# Patient Record
Sex: Male | Born: 1955 | Race: Black or African American | Hispanic: No | Marital: Married | Smoking: Never smoker
Health system: Southern US, Community
[De-identification: ages and names within clinical notes are randomized; demographics above are authoritative.]

## PROBLEM LIST (undated history)

## (undated) DIAGNOSIS — E119 Type 2 diabetes mellitus without complications: Secondary | ICD-10-CM

## (undated) DIAGNOSIS — I10 Essential (primary) hypertension: Secondary | ICD-10-CM

## (undated) DIAGNOSIS — N200 Calculus of kidney: Secondary | ICD-10-CM

---

## 2012-06-03 ENCOUNTER — Encounter (HOSPITAL_COMMUNITY): Payer: Self-pay | Admitting: Emergency Medicine

## 2012-06-03 ENCOUNTER — Emergency Department (HOSPITAL_COMMUNITY)
Admission: EM | Admit: 2012-06-03 | Discharge: 2012-06-03 | Disposition: A | Discharge status: Home / Self Care | Payer: Medicaid Other | Source: Home / Self Care | Attending: Emergency Medicine | Admitting: Emergency Medicine

## 2012-06-03 DIAGNOSIS — K120 Recurrent oral aphthae: Secondary | ICD-10-CM

## 2012-06-03 MED ORDER — PREDNISONE 20 MG PO TABS: 20.0000 mg | ORAL_TABLET | Freq: Two times a day (BID) | ORAL | Status: DC

## 2012-06-03 MED ORDER — TRIAMCINOLONE ACETONIDE 0.1 % MT PSTE: PASTE | OROMUCOSAL | Status: DC

## 2012-06-03 NOTE — ED Provider Notes (Signed)
Chief Complaint  Patient presents with  . Oral Swelling    History of Present Illness:   The patient is a 56 year old male who presents with a 4 to five-day history of the left side of his tongue hurting. This radiates into his teeth and also causes a headache. He's felt feverish. He denies any visual symptoms, nasal congestion, drainage, sore throat, or adenopathy. He's had no numbness, tingling, weakness, or other neurological complaints. He speaks only Arabic and does not speak English very well. The history was obtained with the help of a telephone interpreter.  Review of Systems:  Other than noted above, the patient denies any of the following symptoms: Systemic:  No fevers, chills, sweats, weight loss or gain, fatigue, or tiredness. Eye:  No redness, pain, discharge, itching, blurred vision, or diplopia. ENT:  No headache, nasal congestion, sneezing, itching, epistaxis, ear pain, congestion, decreased hearing, ringing in ears, vertigo, or tinnitus.  No oral lesions, sore throat, pain on swallowing, or hoarseness. Neck:  No mass, tenderness or adenopathy. Lungs:  No coughing, wheezing, or shortness of breath. Skin:  No rash or itching.  PMFSH:  Past medical history, family history, social history, meds, and allergies were reviewed.  Physical Exam:   Vital signs:  BP 143/77  Pulse 55  Temp 98.1 F (36.7 C) (Oral)  Resp 16  SpO2 100% General:  Alert and oriented.  In no distress.  Skin warm and dry. Eye:  PERRL, full EOMs, lids and conjunctiva normal.   ENT:  TMs and canals clear.  Nasal mucosa not congested and without drainage.  Mucous membranes moist, there is a large aphthous ulcer on the left side of the tongue which was very tender to palpation, normal dentition, pharynx clear.  No cranial or facial pain to palplation. Neck:  Supple, full ROM.  No adenopathy, tenderness or mass.  Thyroid normal. Lungs:  Breath sounds clear and equal bilaterally.  No wheezes, rales or  rhonchi. Heart:  Rhythm regular, without extrasystoles.  No gallops or murmers. Skin:  Clear, warm and dry.  Assessment:  The encounter diagnosis was Aphthous ulcer.  Plan:   1.  The following meds were prescribed:   New Prescriptions   PREDNISONE (DELTASONE) 20 MG TABLET    Take 1 tablet (20 mg total) by mouth 2 (two) times daily.   TRIAMCINOLONE (KENALOG) 0.1 % PASTE    Apply to ulcer on tongue 3 times daily.  Do not eat or drink for 30 minutes afterward.   2.  The patient was instructed in symptomatic care and handouts were given. 3.  The patient was told to return if becoming worse in any way, if no better in 3 or 4 days, and given some red flag symptoms that would indicate earlier return.      Reuben Likes, MD 06/03/12 [phone]

## 2012-06-03 NOTE — ED Notes (Signed)
Assessment in progress per Dr. Lorenz Coaster via [facility].

## 2012-06-03 NOTE — ED Notes (Addendum)
Pt states for about 3 days his tongue has been bothering him. The pt states the irritation came on suddenly and doesn't know of any injury or allergen. Pt states it hurts and he can't eat anything. Pt also says he has noticed it being red and swollen. Pt states he has never had anything like this before. Pt states he has blood work and images pending with his primary care physician but he is unaware of any medical history besides kidney stones.

## 2019-01-26 ENCOUNTER — Emergency Department (HOSPITAL_COMMUNITY)
Admission: EM | Admit: 2019-01-26 | Discharge: 2019-01-26 | Disposition: A | Discharge status: Home / Self Care | Payer: Self-pay | Attending: Emergency Medicine | Admitting: Emergency Medicine

## 2019-01-26 ENCOUNTER — Emergency Department (HOSPITAL_COMMUNITY): Payer: Self-pay

## 2019-01-26 ENCOUNTER — Other Ambulatory Visit: Payer: Self-pay

## 2019-01-26 ENCOUNTER — Encounter (HOSPITAL_COMMUNITY): Payer: Self-pay | Admitting: Emergency Medicine

## 2019-01-26 DIAGNOSIS — N200 Calculus of kidney: Secondary | ICD-10-CM | POA: Insufficient documentation

## 2019-01-26 DIAGNOSIS — I10 Essential (primary) hypertension: Secondary | ICD-10-CM | POA: Insufficient documentation

## 2019-01-26 DIAGNOSIS — Z20828 Contact with and (suspected) exposure to other viral communicable diseases: Secondary | ICD-10-CM | POA: Insufficient documentation

## 2019-01-26 HISTORY — DX: Essential (primary) hypertension: I10

## 2019-01-26 HISTORY — DX: Calculus of kidney: N20.0

## 2019-01-26 LAB — I-STAT CHEM 8, ED
BUN: 25 mg/dL — ABNORMAL HIGH (ref 8–23)
Calcium, Ion: 1.12 mmol/L — ABNORMAL LOW (ref 1.15–1.40)
Chloride: 102 mmol/L (ref 98–111)
Creatinine, Ser: 1.2 mg/dL (ref 0.61–1.24)
Glucose, Bld: 137 mg/dL — ABNORMAL HIGH (ref 70–99)
HCT: 43 % (ref 39.0–52.0)
Hemoglobin: 14.6 g/dL (ref 13.0–17.0)
Potassium: 3.4 mmol/L — ABNORMAL LOW (ref 3.5–5.1)
Sodium: 137 mmol/L (ref 135–145)
TCO2: 27 mmol/L (ref 22–32)

## 2019-01-26 LAB — CBC WITH DIFFERENTIAL/PLATELET
Abs Immature Granulocytes: 0.02 10*3/uL (ref 0.00–0.07)
Basophils Absolute: 0.1 10*3/uL (ref 0.0–0.1)
Basophils Relative: 1 %
Eosinophils Absolute: 0 10*3/uL (ref 0.0–0.5)
Eosinophils Relative: 1 %
HCT: 40.9 % (ref 39.0–52.0)
Hemoglobin: 14.2 g/dL (ref 13.0–17.0)
Immature Granulocytes: 0 %
Lymphocytes Relative: 17 %
Lymphs Abs: 1.4 10*3/uL (ref 0.7–4.0)
MCH: 29.8 pg (ref 26.0–34.0)
MCHC: 34.7 g/dL (ref 30.0–36.0)
MCV: 85.9 fL (ref 80.0–100.0)
Monocytes Absolute: 0.6 10*3/uL (ref 0.1–1.0)
Monocytes Relative: 7 %
Neutro Abs: 5.9 10*3/uL (ref 1.7–7.7)
Neutrophils Relative %: 74 %
Platelets: 188 10*3/uL (ref 150–400)
RBC: 4.76 MIL/uL (ref 4.22–5.81)
RDW: 12.7 % (ref 11.5–15.5)
WBC: 8 10*3/uL (ref 4.0–10.5)
nRBC: 0 % (ref 0.0–0.2)

## 2019-01-26 LAB — URINALYSIS, ROUTINE W REFLEX MICROSCOPIC
Bilirubin Urine: NEGATIVE
Glucose, UA: NEGATIVE mg/dL
Ketones, ur: NEGATIVE mg/dL
Leukocytes,Ua: NEGATIVE
Nitrite: NEGATIVE
Protein, ur: NEGATIVE mg/dL
Specific Gravity, Urine: 1.017 (ref 1.005–1.030)
pH: 6 (ref 5.0–8.0)

## 2019-01-26 LAB — SARS CORONAVIRUS 2 BY RT PCR (HOSPITAL ORDER, PERFORMED IN ~~LOC~~ HOSPITAL LAB): SARS Coronavirus 2: NEGATIVE

## 2019-01-26 MED ORDER — FENTANYL CITRATE (PF) 100 MCG/2ML IJ SOLN
100.0000 ug | Freq: Once | INTRAMUSCULAR | Status: AC
  Administered 2019-01-26: 100 ug via INTRAVENOUS
  Filled 2019-01-26: qty 2

## 2019-01-26 MED ORDER — OXYCODONE-ACETAMINOPHEN 5-325 MG PO TABS
2.0000 | ORAL_TABLET | Freq: Once | ORAL | Status: AC
  Administered 2019-01-26: 2 via ORAL
  Filled 2019-01-26: qty 2

## 2019-01-26 MED ORDER — ONDANSETRON HCL 4 MG/2ML IJ SOLN
4.0000 mg | Freq: Once | INTRAMUSCULAR | Status: AC
  Administered 2019-01-26: 4 mg via INTRAVENOUS
  Filled 2019-01-26: qty 2

## 2019-01-26 MED ORDER — ONDANSETRON 8 MG PO TBDP: ORAL_TABLET | ORAL | 0 refills | Status: DC

## 2019-01-26 MED ORDER — DICLOFENAC SODIUM ER 100 MG PO TB24: 100.0000 mg | ORAL_TABLET | Freq: Every day | ORAL | 0 refills | Status: DC

## 2019-01-26 MED ORDER — OXYCODONE-ACETAMINOPHEN 5-325 MG PO TABS: 1.0000 | ORAL_TABLET | Freq: Four times a day (QID) | ORAL | 0 refills | Status: DC | PRN

## 2019-01-26 MED ORDER — KETOROLAC TROMETHAMINE 30 MG/ML IJ SOLN
15.0000 mg | Freq: Once | INTRAMUSCULAR | Status: AC
  Administered 2019-01-26: 15 mg via INTRAVENOUS
  Filled 2019-01-26: qty 1

## 2019-01-26 MED ORDER — TAMSULOSIN HCL 0.4 MG PO CAPS
0.4000 mg | ORAL_CAPSULE | ORAL | Status: AC
  Administered 2019-01-26: 0.4 mg via ORAL
  Filled 2019-01-26: qty 1

## 2019-01-26 MED ORDER — TAMSULOSIN HCL 0.4 MG PO CAPS: 0.4000 mg | ORAL_CAPSULE | Freq: Every day | ORAL | 0 refills | Status: DC

## 2019-01-26 MED ORDER — FENTANYL CITRATE (PF) 100 MCG/2ML IJ SOLN
100.0000 ug | Freq: Once | INTRAMUSCULAR | Status: AC
Start: 2019-01-26 — End: 2019-01-26
  Administered 2019-01-26: 100 ug via INTRAVENOUS
  Filled 2019-01-26: qty 2

## 2019-01-26 NOTE — ED Triage Notes (Signed)
Arrives via EMS from home. C/C L flank and ad pain starting x2 hours ago, pain 6/10. CBG 141. Stated he had blood in the urine.

## 2019-01-26 NOTE — ED Provider Notes (Signed)
[facility]-EMERGENCY DEPT Provider Note   CSN: [id number] Arrival date & time: 01/26/19  0226     History   Chief Complaint Chief Complaint  Patient presents with  . Flank Pain    HPI Eric Garrett is a 63 y.o. male.     The history is provided by the patient. The history is limited by a language barrier. A language interpreter was used.  Flank Pain This is a recurrent problem. The current episode started 1 to 2 hours ago. The problem occurs constantly. Pertinent negatives include no chest pain, no abdominal pain, no headaches and no shortness of breath. Nothing aggravates the symptoms. Nothing relieves the symptoms. He has tried nothing for the symptoms. The treatment provided no relief.  Patient with a h/o kidney stones who presents with L flank pain with associated nausea and emesis x 2 as well as hematuria.    Past Medical History:  Diagnosis Date  . Hypertension   . Kidney stones     There are no active problems to display for this patient.   No past surgical history on file.      Home Medications    Prior to Admission medications   Medication Sig Start Date End Date Taking? Authorizing Provider  predniSONE (DELTASONE) 20 MG tablet Take 1 tablet (20 mg total) by mouth 2 (two) times daily. 06/03/12   Reuben LikesKeller, David C, MD  triamcinolone (KENALOG) 0.1 % paste Apply to ulcer on tongue 3 times daily.  Do not eat or drink for 30 minutes afterward. 06/03/12   Reuben LikesKeller, David C, MD    Family History No family history on file.  Social History Social History   Tobacco Use  . Smoking status: Never Smoker  Substance Use Topics  . Alcohol use: No  . Drug use: Not on file     Allergies   Patient has no known allergies.   Review of Systems Review of Systems  Constitutional: Negative for fever.  Respiratory: Negative for cough and shortness of breath.   Cardiovascular: Negative for chest pain.  Gastrointestinal: Positive for nausea and  vomiting. Negative for abdominal pain.  Genitourinary: Positive for flank pain and hematuria. Negative for dysuria.  Neurological: Negative for headaches.  All other systems reviewed and are negative.    Physical Exam Updated Vital Signs BP (!) 138/97 (BP Location: Left Arm)   Pulse 87   Temp 98.7 F (37.1 C) (Oral)   Resp 16   Ht 6' 0.84" (1.85 m)   Wt 85 kg   SpO2 94%   BMI 24.84 kg/m   Physical Exam Vitals signs and nursing note reviewed.  Constitutional:      General: He is not in acute distress.    Appearance: He is normal weight.  HENT:     Head: Normocephalic and atraumatic.     Nose: Nose normal.  Eyes:     Conjunctiva/sclera: Conjunctivae normal.     Pupils: Pupils are equal, round, and reactive to light.  Neck:     Musculoskeletal: Normal range of motion and neck supple.  Cardiovascular:     Rate and Rhythm: Normal rate and regular rhythm.     Pulses: Normal pulses.     Heart sounds: Normal heart sounds.  Pulmonary:     Effort: Pulmonary effort is normal.     Breath sounds: Normal breath sounds.  Abdominal:     General: Abdomen is flat. Bowel sounds are normal.     Tenderness: There is no  abdominal tenderness. There is no guarding or rebound.  Musculoskeletal: Normal range of motion.  Skin:    General: Skin is warm and dry.     Capillary Refill: Capillary refill takes less than 2 seconds.  Neurological:     General: No focal deficit present.     Mental Status: He is alert and oriented to person, place, and time.  Psychiatric:        Mood and Affect: Mood normal.        Behavior: Behavior normal.      ED Treatments / Results  Labs (all labs ordered are listed, but only abnormal results are displayed) Results for orders placed or performed during the hospital encounter of 01/26/19  CBC with Differential/Platelet  Result Value Ref Range   WBC 8.0 4.0 - 10.5 K/uL   RBC 4.76 4.22 - 5.81 MIL/uL   Hemoglobin 14.2 13.0 - 17.0 g/dL   HCT 40.9 39.0 -  52.0 %   MCV 85.9 80.0 - 100.0 fL   MCH 29.8 26.0 - 34.0 pg   MCHC 34.7 30.0 - 36.0 g/dL   RDW 12.7 11.5 - 15.5 %   Platelets 188 150 - 400 K/uL   nRBC 0.0 0.0 - 0.2 %   Neutrophils Relative % 74 %   Neutro Abs 5.9 1.7 - 7.7 K/uL   Lymphocytes Relative 17 %   Lymphs Abs 1.4 0.7 - 4.0 K/uL   Monocytes Relative 7 %   Monocytes Absolute 0.6 0.1 - 1.0 K/uL   Eosinophils Relative 1 %   Eosinophils Absolute 0.0 0.0 - 0.5 K/uL   Basophils Relative 1 %   Basophils Absolute 0.1 0.0 - 0.1 K/uL   Immature Granulocytes 0 %   Abs Immature Granulocytes 0.02 0.00 - 0.07 K/uL  I-stat chem 8, ED (not at [facility] or [facility])  Result Value Ref Range   Sodium 137 135 - 145 mmol/L   Potassium 3.4 (L) 3.5 - 5.1 mmol/L   Chloride 102 98 - 111 mmol/L   BUN 25 (H) 8 - 23 mg/dL   Creatinine, Ser 1.20 0.61 - 1.24 mg/dL   Glucose, Bld 137 (H) 70 - 99 mg/dL   Calcium, Ion 1.12 (L) 1.15 - 1.40 mmol/L   TCO2 27 22 - 32 mmol/L   Hemoglobin 14.6 13.0 - 17.0 g/dL   HCT 43.0 39.0 - 52.0 %   Ct Renal Stone Study  Result Date: 01/26/2019 CLINICAL DATA:  Left-sided flank pain with hematuria EXAM: CT ABDOMEN AND PELVIS WITHOUT CONTRAST TECHNIQUE: Multidetector CT imaging of the abdomen and pelvis was performed following the standard protocol without IV contrast. COMPARISON:  None. FINDINGS: Lower chest: No acute abnormality. Hepatobiliary: No focal liver abnormality is seen. No gallstones, gallbladder wall thickening, or biliary dilatation. Pancreas: Unremarkable. No pancreatic ductal dilatation or surrounding inflammatory changes. Spleen: Normal in size without focal abnormality. Adrenals/Urinary Tract: Adrenal glands are normal. No right hydronephrosis. Moderate left perinephric fat stranding. Probable cyst in the mid left kidney containing small amount of peripheral calcification. Moderate left hydronephrosis and proximal hydroureter, secondary to a 7 x 8 mm stone in the proximal left ureter at approximate L3 level. The  urinary bladder is normal. Stomach/Bowel: Stomach is within normal limits. Appendix appears normal. No evidence of bowel wall thickening, distention, or inflammatory changes. Vascular/Lymphatic: Mild aortic atherosclerosis. No aneurysm. No significantly enlarged lymph nodes Reproductive: Prostate is unremarkable. Other: Negative for free air or free fluid Musculoskeletal: Chronic bilateral pars defect at L5. No significant listhesis IMPRESSION:  1. Moderate left hydronephrosis and proximal hydroureter, secondary to a 7 x 8 mm stone in the proximal left ureter at approximate L3 level 2. 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   On: 01/26/2019 03:57    Radiology Ct Renal Stone Study  Result Date: 01/26/2019 CLINICAL DATA:  Left-sided flank pain with hematuria EXAM: CT ABDOMEN AND PELVIS WITHOUT CONTRAST TECHNIQUE: Multidetector CT imaging of the abdomen and pelvis was performed following the standard protocol without IV contrast. COMPARISON:  None. FINDINGS: Lower chest: No acute abnormality. Hepatobiliary: No focal liver abnormality is seen. No gallstones, gallbladder wall thickening, or biliary dilatation. Pancreas: Unremarkable. No pancreatic ductal dilatation or surrounding inflammatory changes. Spleen: Normal in size without focal abnormality. Adrenals/Urinary Tract: Adrenal glands are normal. No right hydronephrosis. Moderate left perinephric fat stranding. Probable cyst in the mid left kidney containing small amount of peripheral calcification. Moderate left hydronephrosis and proximal hydroureter, secondary to a 7 x 8 mm stone in the proximal left ureter at approximate L3 level. The urinary bladder is normal. Stomach/Bowel: Stomach is within normal limits. Appendix appears normal. No evidence of bowel wall thickening, distention, or inflammatory changes. Vascular/Lymphatic: Mild aortic atherosclerosis. No aneurysm. No significantly enlarged lymph nodes  Reproductive: Prostate is unremarkable. Other: Negative for free air or free fluid Musculoskeletal: Chronic bilateral pars defect at L5. No significant listhesis IMPRESSION: 1. Moderate left hydronephrosis and proximal hydroureter, secondary to a 7 x 8 mm stone in the proximal left ureter at approximate L3 level 2. 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   On: 01/26/2019 03:57    Procedures Procedures (including critical care time)  Medications Ordered in ED Medications  oxyCODONE-acetaminophen (PERCOCET/ROXICET) 5-325 MG per tablet 2 tablet (has no administration in time range)  ondansetron (ZOFRAN) injection 4 mg (4 mg Intravenous Given 01/26/19 0311)  fentaNYL (SUBLIMAZE) injection 100 mcg (100 mcg Intravenous Given 01/26/19 0311)  ketorolac (TORADOL) 30 MG/ML injection 15 mg (15 mg Intravenous Given 01/26/19 0311)  tamsulosin (FLOMAX) capsule 0.4 mg (0.4 mg Oral Given 01/26/19 0311)  fentaNYL (SUBLIMAZE) injection 100 mcg (100 mcg Intravenous Given 01/26/19 0433)    Case d/w Dr. Laverle PatterBorden.  Patient can follow up in resident clinic.    Will provide strainer. Pain medication and nausea medication.  Follow up in the office within 7 days.     Final Clinical Impressions(s) / ED Diagnoses   Return for intractable cough, coughing up blood,fevers >100.4 unrelieved by medication, shortness of breath, intractable vomiting, chest pain, shortness of breath, weakness,numbness, changes in speech, facial asymmetry,abdominal pain, passing out,Inability to tolerate liquids or food, cough, altered mental status or any concerns. No signs of systemic illness or infection. The patient is nontoxic-appearing on exam and vital signs are within normal limits.   I have reviewed the triage vital signs and the nursing notes. Pertinent labs &imaging results that were available during my care of the patient were reviewed by me and considered in my medical  decision making (see chart for details).  After history, exam, and medical workup I feel the patient has been appropriately medically screened and is safe for discharge home. Pertinent diagnoses were discussed with the patient. Patient was given return precautions    Saina Waage, MD 01/26/19 [id number]

## 2019-09-24 ENCOUNTER — Other Ambulatory Visit: Payer: Self-pay

## 2019-09-24 ENCOUNTER — Ambulatory Visit (HOSPITAL_COMMUNITY)
Admission: EM | Admit: 2019-09-24 | Discharge: 2019-09-24 | Disposition: A | Discharge status: Home / Self Care | Payer: PRIVATE HEALTH INSURANCE | Attending: Urgent Care | Admitting: Urgent Care

## 2019-09-24 ENCOUNTER — Encounter (HOSPITAL_COMMUNITY): Payer: Self-pay

## 2019-09-24 DIAGNOSIS — N23 Unspecified renal colic: Secondary | ICD-10-CM

## 2019-09-24 DIAGNOSIS — N201 Calculus of ureter: Secondary | ICD-10-CM

## 2019-09-24 DIAGNOSIS — M549 Dorsalgia, unspecified: Secondary | ICD-10-CM

## 2019-09-24 DIAGNOSIS — M546 Pain in thoracic spine: Secondary | ICD-10-CM

## 2019-09-24 DIAGNOSIS — I10 Essential (primary) hypertension: Secondary | ICD-10-CM

## 2019-09-24 MED ORDER — TAMSULOSIN HCL 0.4 MG PO CAPS: 0.4000 mg | ORAL_CAPSULE | Freq: Every day | ORAL | 0 refills | Status: DC

## 2019-09-24 MED ORDER — AMLODIPINE BESYLATE 5 MG PO TABS: 5.0000 mg | ORAL_TABLET | Freq: Every day | ORAL | 0 refills | Status: DC

## 2019-09-24 MED ORDER — TIZANIDINE HCL 4 MG PO TABS: 4.0000 mg | ORAL_TABLET | Freq: Four times a day (QID) | ORAL | 0 refills | Status: DC | PRN

## 2019-09-24 MED ORDER — TRAMADOL HCL 50 MG PO TABS: 50.0000 mg | ORAL_TABLET | Freq: Four times a day (QID) | ORAL | 0 refills | Status: DC | PRN

## 2019-09-24 NOTE — ED Triage Notes (Signed)
Pt present back pain, symptom started over a week ago. 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

## 2019-09-24 NOTE — Discharge Instructions (Addendum)
Please schedule Tylenol at 500mg  - 650mg  once every 6 hours as needed for back aches and pains.  If you still have pain despite taking Tylenol regularly, this is breakthrough pain.  You can use tramadol once every 6 hours for this.  Once your pain is better controlled, switch back to just Tylenol.    For elevated blood pressure, make sure you are monitoring salt in your diet.  Do not eat restaurant foods and limit processed foods at home.  Processed foods include things like frozen meals preseason meats and dinners.  Make sure your pain attention to sodium labels on foods you by at the grocery store.  For seasoning you can use a brand called Mrs. Dash which includes a lot of salt free seasonings.  Salads - kale, spinach, cabbage, spring mix; use seeds like pumpkin seeds or sunflower seeds, almonds; you can also use 1-2 hard boiled eggs in your salads Fruits - avocadoes, berries (blueberries, raspberries, blackberries), apples, oranges Vegetables - aspargus, cauliflower, broccoli, green beans, brussel spouts, bell peppers; stay away from starchy vegetables like potatoes, carrots, peas  Regarding meat it is better to eat lean meats and limit your red meat consumption including pork.  Wild caught fish, chicken breast are good options.  Do not eat any foods on this list that you are allergic to.

## 2019-09-24 NOTE — ED Provider Notes (Signed)
[facility]   MRN: [id number] DOB: 1956-05-12  Subjective:   Eric Garrett is a 64 y.o. male presenting for left-sided thoracic back pain.  Patient has a history of kidney stones, states he has had some intermittent hematuria but not recently.  He tries to stay well-hydrated.  He is also requesting medication for his blood pressure.  Has previously been told that he has high blood pressure but does not want to take any medications.  Denies headache, confusion, weakness, chest pain, shortness of breath, nausea, vomiting, belly pain, dysuria, urinary frequency.  Does not take any regular medications.    No Known Allergies  Past Medical History:  Diagnosis Date  . Hypertension   . Kidney stones     History reviewed. No pertinent surgical history.  History reviewed. No pertinent family history.  Social History   Tobacco Use  . Smoking status: Never Smoker  Substance Use Topics  . Alcohol use: No  . Drug use: Not on file    ROS   Objective:   Vitals: BP (!) 179/100 (BP Location: Right Arm)   Pulse 69   Temp 97.8 F (36.6 C) (Oral)   Resp 18   SpO2 100%   BP Readings from Last 3 Encounters:  09/24/19 (!) 179/100  01/26/19 139/69  06/03/12 143/77   Physical Exam Constitutional:      General: He is not in acute distress.    Appearance: Normal appearance. He is well-developed. He is not ill-appearing, toxic-appearing or diaphoretic.  HENT:     Head: Normocephalic and atraumatic.     Right Ear: External ear normal.     Left Ear: External ear normal.     Nose: Nose normal.     Mouth/Throat:     Mouth: Mucous membranes are moist.     Pharynx: Oropharynx is clear.  Eyes:     General: No scleral icterus.    Extraocular Movements: Extraocular movements intact.     Pupils: Pupils are equal, round, and reactive to light.  Cardiovascular:     Rate and Rhythm: Normal rate and regular rhythm.     Heart sounds: Normal heart sounds. No murmur. No friction rub.  No gallop.   Pulmonary:     Effort: Pulmonary effort is normal. No respiratory distress.     Breath sounds: Normal breath sounds. No stridor. No wheezing, rhonchi or rales.  Abdominal:     General: Bowel sounds are normal. There is no distension.     Palpations: Abdomen is soft. There is no mass.     Tenderness: There is no abdominal tenderness. There is no right CVA tenderness, left CVA tenderness, guarding or rebound.  Musculoskeletal:     Cervical back: Spasms (trapezius muscles) present. No swelling, edema, deformity, erythema, signs of trauma, lacerations, rigidity, torticollis, tenderness or bony tenderness. Normal range of motion.     Thoracic back: Spasms and tenderness present. No swelling, edema, deformity, signs of trauma or bony tenderness. No scoliosis.     Lumbar back: Spasms present. No swelling, edema, deformity, signs of trauma, lacerations, tenderness or bony tenderness. Normal range of motion. No scoliosis.       Back:  Skin:    General: Skin is warm and dry.  Neurological:     Mental Status: He is alert and oriented to person, place, and time.     Cranial Nerves: No cranial nerve deficit.     Motor: No weakness.     Coordination: Coordination normal.  Gait: Gait normal.     Deep Tendon Reflexes: Reflexes normal.  Psychiatric:        Mood and Affect: Mood normal.        Behavior: Behavior normal.        Thought Content: Thought content normal.        Judgment: Judgment normal.     Assessment and Plan :   1. Acute left-sided thoracic back pain   2. Upper back pain   3. Left ureteral stone   4. Essential hypertension   5. Renal colic     Start amlodipine for HTN. Patient has recurrent renal colic, use APAP with tramadol for breakthrough pain. APAP can help with musculoskeletal pain given strenuous nature of his work, use Zanaflex for this as well. Patient has a PCP, has an appt in mid April. At this time, patient does not have signs of ACS, stroke.  Counseled patient on potential for adverse effects with medications prescribed/recommended today, ER and return-to-clinic precautions discussed, patient verbalized understanding.    Wallis Bamberg, PA-C 09/24/19 1819

## 2019-11-27 ENCOUNTER — Other Ambulatory Visit: Payer: Self-pay

## 2019-11-27 ENCOUNTER — Encounter (HOSPITAL_COMMUNITY): Payer: Self-pay

## 2019-11-27 ENCOUNTER — Ambulatory Visit (HOSPITAL_COMMUNITY)
Admission: EM | Admit: 2019-11-27 | Discharge: 2019-11-27 | Disposition: A | Discharge status: Home / Self Care | Payer: PRIVATE HEALTH INSURANCE | Attending: Family Medicine | Admitting: Family Medicine

## 2019-11-27 DIAGNOSIS — K0889 Other specified disorders of teeth and supporting structures: Secondary | ICD-10-CM

## 2019-11-27 DIAGNOSIS — K047 Periapical abscess without sinus: Secondary | ICD-10-CM

## 2019-11-27 MED ORDER — IBUPROFEN 800 MG PO TABS: 800.0000 mg | ORAL_TABLET | Freq: Three times a day (TID) | ORAL | 0 refills | Status: DC

## 2019-11-27 MED ORDER — PENICILLIN V POTASSIUM 500 MG PO TABS: 500.0000 mg | ORAL_TABLET | Freq: Three times a day (TID) | ORAL | 0 refills | Status: AC

## 2019-11-27 NOTE — ED Triage Notes (Signed)
PT c/o lower right sided gum that bleeds when brushes teeth. Pt c/o 4/10 pain in right lower gum. Pt denies trouble chewing or swallowing.

## 2019-11-27 NOTE — Discharge Instructions (Signed)
Take ibuprofen 3 times a day with food.  This is for pain and inflammation. Take penicillin 3 times a day for 7 days.  This will take time infection See a dentist in follow-up

## 2019-11-27 NOTE — ED Notes (Signed)
I used interpreter pad to triage pt 

## 2019-11-27 NOTE — ED Provider Notes (Signed)
[facility]    CSN: [id number] Arrival date & time: 11/27/19  1737      History   Chief Complaint Chief Complaint  Patient presents with  . Dental Pain    HPI Eric Garrett is a 64 y.o. male.   HPI  Patient has dental pain.  Swollen gums.  Easy bleed ability.  Been going on for several days.  He does not get regular dental care. No trauma or injury. Otherwise in good health. Compliant with his blood pressure medication.  Past Medical History:  Diagnosis Date  . Hypertension   . Kidney stones     There are no problems to display for this patient.   History reviewed. No pertinent surgical history.     Home Medications    Prior to Admission medications   Medication Sig Start Date End Date Taking? Authorizing Provider  amLODipine (NORVASC) 5 MG tablet Take 1 tablet (5 mg total) by mouth daily. 09/24/19   Wallis Bamberg, PA-C  ibuprofen (ADVIL) 800 MG tablet Take 1 tablet (800 mg total) by mouth 3 (three) times daily. 11/27/19   Eustace Moore, MD  penicillin v potassium (VEETID) 500 MG tablet Take 1 tablet (500 mg total) by mouth 3 (three) times daily for 7 days. 11/27/19 12/04/19  Eustace Moore, MD  tamsulosin (FLOMAX) 0.4 MG CAPS capsule Take 1 capsule (0.4 mg total) by mouth daily. 09/24/19   Wallis Bamberg, PA-C    Family History No family history on file.  Social History Social History   Tobacco Use  . Smoking status: Never Smoker  Substance Use Topics  . Alcohol use: No  . Drug use: Never     Allergies   Patient has no known allergies.   Review of Systems Review of Systems  HENT: Positive for dental problem.      Physical Exam Triage Vital Signs ED Triage Vitals  Enc Vitals Group     BP 11/27/19 1808 (!) 159/89     Pulse Rate 11/27/19 1808 68     Resp 11/27/19 1808 18     Temp 11/27/19 1808 98.7 F (37.1 C)     Temp Source 11/27/19 1808 Oral     SpO2 11/27/19 1808 100 %     Weight 11/27/19 1813 205 lb 0.4 oz (93 kg)   Height 11/27/19 1813 6\' 1"  (1.854 m)     Head Circumference --      Peak Flow --      Pain Score 11/27/19 1812 4     Pain Loc --      Pain Edu? --      Excl. in GC? --    No data found.  Updated Vital Signs BP (!) 159/89   Pulse 68   Temp 98.7 F (37.1 C) (Oral)   Resp 18   Ht 6\' 1"  (1.854 m)   Wt 93 kg   SpO2 100%   BMI 27.05 kg/m      Physical Exam Constitutional:      General: He is not in acute distress.    Appearance: He is well-developed and normal weight.     Comments: No acute distress  HENT:     Head: Normocephalic and atraumatic.     Nose: Nose normal. No congestion.     Mouth/Throat:     Mouth: Mucous membranes are moist.     Pharynx: Posterior oropharyngeal erythema present.     Comments: The right wisdom tooth area is swollen and there  is erythema of the gums, easy friability.  The tooth is only partially erupted and needs to be removed. Eyes:     Conjunctiva/sclera: Conjunctivae normal.     Pupils: Pupils are equal, round, and reactive to light.  Cardiovascular:     Rate and Rhythm: Normal rate and regular rhythm.  Pulmonary:     Effort: Pulmonary effort is normal. No respiratory distress.     Breath sounds: Normal breath sounds.  Musculoskeletal:        General: Normal range of motion.     Cervical back: Normal range of motion.  Lymphadenopathy:     Cervical: Cervical adenopathy present.  Skin:    General: Skin is warm and dry.  Neurological:     Mental Status: He is alert.  Psychiatric:        Mood and Affect: Mood normal.        Behavior: Behavior normal.      UC Treatments / Results  Labs (all labs ordered are listed, but only abnormal results are displayed) Labs Reviewed - No data to display  EKG   Radiology No results found.  Procedures Procedures (including critical care time)  Medications Ordered in UC Medications - No data to display  Initial Impression / Assessment and Plan / UC Course  I have reviewed the triage  vital signs and the nursing notes.  Pertinent labs & imaging results that were available during my care of the patient were reviewed by me and considered in my medical decision making (see chart for details).     Reviewed with patient the need to see a dentist.  Will treat with antibiotics and ibuprofen. Final Clinical Impressions(s) / UC Diagnoses   Final diagnoses:  Pain, dental  Dental infection     Discharge Instructions     Take ibuprofen 3 times a day with food.  This is for pain and inflammation. Take penicillin 3 times a day for 7 days.  This will take time infection See a dentist in follow-up   ED Prescriptions    Medication Sig Dispense Auth. Provider   penicillin v potassium (VEETID) 500 MG tablet Take 1 tablet (500 mg total) by mouth 3 (three) times daily for 7 days. 21 tablet Raylene Everts, MD   ibuprofen (ADVIL) 800 MG tablet Take 1 tablet (800 mg total) by mouth 3 (three) times daily. 21 tablet Raylene Everts, MD     PDMP not reviewed this encounter.   Raylene Everts, MD 11/27/19 [phone]

## 2020-02-12 ENCOUNTER — Other Ambulatory Visit: Payer: Self-pay

## 2020-02-12 ENCOUNTER — Encounter (HOSPITAL_COMMUNITY): Payer: Self-pay | Admitting: Emergency Medicine

## 2020-02-12 ENCOUNTER — Ambulatory Visit (HOSPITAL_COMMUNITY)
Admission: EM | Admit: 2020-02-12 | Discharge: 2020-02-12 | Disposition: A | Discharge status: Home / Self Care | Payer: PRIVATE HEALTH INSURANCE | Attending: Family Medicine | Admitting: Family Medicine

## 2020-02-12 DIAGNOSIS — R0789 Other chest pain: Secondary | ICD-10-CM | POA: Diagnosis not present

## 2020-02-12 DIAGNOSIS — I10 Essential (primary) hypertension: Secondary | ICD-10-CM

## 2020-02-12 MED ORDER — TIZANIDINE HCL 4 MG PO TABS: 4.0000 mg | ORAL_TABLET | Freq: Every day | ORAL | 0 refills | Status: DC

## 2020-02-12 MED ORDER — MELOXICAM 7.5 MG PO TABS: 7.5000 mg | ORAL_TABLET | Freq: Every day | ORAL | 0 refills | Status: DC

## 2020-02-12 NOTE — Discharge Instructions (Addendum)
Make sure you work 5 days a week or less. Do 40 hours of work in 1 week or less. This will help with your chest wall pain. Use meloxicam 1 tablet daily if you need it for pain. Tizanidine is a muscle relaxant and can help at bedtime.

## 2020-02-12 NOTE — ED Provider Notes (Signed)
[facility]   MRN: [id number] DOB: 17-Jul-1955  Subjective:   Eric Garrett is a 64 y.o. male presenting for 1 week hx of persistent intermittent left sided chest wall pain, worsened/elicited with deep breath. Denies fall, trauma, cough, shob. Denies hx of diabetes, heart disease. Has HTN, takes amlodipine for this. Saw his PCP last week and was checked up completely, states that it was a normal check up including EKG and diabetes check both of which were normal.   No current facility-administered medications for this encounter.  Current Outpatient Medications:  .  amLODipine (NORVASC) 5 MG tablet, Take 1 tablet (5 mg total) by mouth daily., Disp: 90 tablet, Rfl: 0 .  ibuprofen (ADVIL) 800 MG tablet, Take 1 tablet (800 mg total) by mouth 3 (three) times daily., Disp: 21 tablet, Rfl: 0 .  tamsulosin (FLOMAX) 0.4 MG CAPS capsule, Take 1 capsule (0.4 mg total) by mouth daily., Disp: 30 capsule, Rfl: 0   No Known Allergies  Past Medical History:  Diagnosis Date  . Hypertension   . Kidney stones      History reviewed. No pertinent surgical history.  History reviewed. No pertinent family history.  Social History   Tobacco Use  . Smoking status: Never Smoker  Substance Use Topics  . Alcohol use: No  . Drug use: Never    ROS   Objective:   Vitals: BP (!) 153/84 (BP Location: Right Arm)   Pulse (!) 55   Temp 98.6 F (37 C) (Oral)   Resp 18   SpO2 97%   BP 148/73 on recheck.   Physical Exam Constitutional:      General: He is not in acute distress.    Appearance: Normal appearance. He is well-developed. He is not ill-appearing, toxic-appearing or diaphoretic.  HENT:     Head: Normocephalic and atraumatic.     Right Ear: External ear normal.     Left Ear: External ear normal.     Nose: Nose normal.     Mouth/Throat:     Mouth: Mucous membranes are moist.     Pharynx: Oropharynx is clear.  Eyes:     General: No scleral icterus.       Right eye: No  discharge.        Left eye: No discharge.     Extraocular Movements: Extraocular movements intact.     Conjunctiva/sclera: Conjunctivae normal.     Pupils: Pupils are equal, round, and reactive to light.  Cardiovascular:     Rate and Rhythm: Normal rate and regular rhythm.     Heart sounds: Normal heart sounds. No murmur heard.  No friction rub. No gallop.   Pulmonary:     Effort: Pulmonary effort is normal. No respiratory distress.     Breath sounds: Normal breath sounds. No stridor. No wheezing, rhonchi or rales.  Chest:     Chest wall: Tenderness present.       Comments: No bony deformity, skin changes, rash. Abdominal:     General: Bowel sounds are normal. There is no distension.     Palpations: Abdomen is soft. There is no mass.     Tenderness: There is no abdominal tenderness. There is no right CVA tenderness, left CVA tenderness, guarding or rebound.  Skin:    General: Skin is warm and dry.  Neurological:     Mental Status: He is alert and oriented to person, place, and time.  Psychiatric:        Mood and Affect: Mood normal.  Behavior: Behavior normal.        Thought Content: Thought content normal.        Judgment: Judgment normal.     ED ECG REPORT   Date: 02/12/2020  Rate: 56bpm  Rhythm: sinus bradycardia  QRS Axis: normal  Intervals: normal  ST/T Wave abnormalities: normal  Conduction Disutrbances:none  Narrative Interpretation: Sinus bradycardia at 56 bpm.  No previous EKG available for comparison.  Old EKG Reviewed: none available  I have personally reviewed the EKG tracing and agree with the computerized printout as noted.   Assessment and Plan :   PDMP not reviewed this encounter.  1. Chest wall pain   2. Essential hypertension     Suspect musculoskeletal pain due to nature of his work, does 6 days a week, 10-12 hours per shift. Vital signs improving, EKG reassuring. He also had a normal checkup just last week and for now we will use  conservative management. Note for work provided. Counseled patient on potential for adverse effects with medications prescribed/recommended today, ER and return-to-clinic precautions discussed, patient verbalized understanding.    Wallis Bamberg, [place] 02/12/20 1924

## 2020-02-12 NOTE — ED Triage Notes (Signed)
Pt here for left sided CP worse with inspiration and movement starting yesterday; pt denies SOB or cough; pt sts stopped his BP meds over a week ago

## 2020-03-03 ENCOUNTER — Emergency Department (HOSPITAL_COMMUNITY): Payer: PRIVATE HEALTH INSURANCE

## 2020-03-03 ENCOUNTER — Other Ambulatory Visit: Payer: Self-pay

## 2020-03-03 ENCOUNTER — Encounter (HOSPITAL_COMMUNITY): Payer: Self-pay | Admitting: *Deleted

## 2020-03-03 ENCOUNTER — Emergency Department (HOSPITAL_COMMUNITY)
Admission: EM | Admit: 2020-03-03 | Discharge: 2020-03-03 | Disposition: A | Discharge status: Home / Self Care | Payer: PRIVATE HEALTH INSURANCE | Attending: Emergency Medicine | Admitting: Emergency Medicine

## 2020-03-03 DIAGNOSIS — Y9389 Activity, other specified: Secondary | ICD-10-CM | POA: Insufficient documentation

## 2020-03-03 DIAGNOSIS — S0240CA Maxillary fracture, right side, initial encounter for closed fracture: Secondary | ICD-10-CM | POA: Insufficient documentation

## 2020-03-03 DIAGNOSIS — Z79899 Other long term (current) drug therapy: Secondary | ICD-10-CM | POA: Insufficient documentation

## 2020-03-03 DIAGNOSIS — S0993XA Unspecified injury of face, initial encounter: Secondary | ICD-10-CM | POA: Diagnosis present

## 2020-03-03 DIAGNOSIS — I10 Essential (primary) hypertension: Secondary | ICD-10-CM | POA: Diagnosis not present

## 2020-03-03 DIAGNOSIS — S0292XA Unspecified fracture of facial bones, initial encounter for closed fracture: Secondary | ICD-10-CM

## 2020-03-03 DIAGNOSIS — Y999 Unspecified external cause status: Secondary | ICD-10-CM | POA: Diagnosis not present

## 2020-03-03 DIAGNOSIS — Y9289 Other specified places as the place of occurrence of the external cause: Secondary | ICD-10-CM | POA: Diagnosis not present

## 2020-03-03 DIAGNOSIS — S0232XA Fracture of orbital floor, left side, initial encounter for closed fracture: Secondary | ICD-10-CM | POA: Insufficient documentation

## 2020-03-03 DIAGNOSIS — H539 Unspecified visual disturbance: Secondary | ICD-10-CM | POA: Diagnosis not present

## 2020-03-03 MED ORDER — ACETAMINOPHEN 500 MG PO TABS
1000.0000 mg | ORAL_TABLET | Freq: Once | ORAL | Status: AC
  Administered 2020-03-03: 1000 mg via ORAL
  Filled 2020-03-03: qty 2

## 2020-03-03 MED ORDER — FLUORESCEIN SODIUM 1 MG OP STRP
1.0000 | ORAL_STRIP | Freq: Once | OPHTHALMIC | Status: AC
  Administered 2020-03-03: 1 via OPHTHALMIC
  Filled 2020-03-03: qty 1

## 2020-03-03 MED ORDER — OXYCODONE HCL 5 MG PO TABS
5.0000 mg | ORAL_TABLET | Freq: Once | ORAL | Status: AC
  Administered 2020-03-03: 5 mg via ORAL
  Filled 2020-03-03: qty 1
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
## 2020-03-06 ENCOUNTER — Other Ambulatory Visit: Payer: Self-pay

## 2020-03-06 ENCOUNTER — Encounter (HOSPITAL_COMMUNITY): Payer: Self-pay

## 2020-03-06 ENCOUNTER — Ambulatory Visit (HOSPITAL_COMMUNITY)
Admission: EM | Admit: 2020-03-06 | Discharge: 2020-03-06 | Disposition: A | Discharge status: Home / Self Care | Payer: PRIVATE HEALTH INSURANCE | Attending: Urgent Care | Admitting: Urgent Care

## 2020-03-06 DIAGNOSIS — H5713 Ocular pain, bilateral: Secondary | ICD-10-CM

## 2020-03-06 DIAGNOSIS — S0232XA Fracture of orbital floor, left side, initial encounter for closed fracture: Secondary | ICD-10-CM

## 2020-03-06 DIAGNOSIS — S02401A Maxillary fracture, unspecified, initial encounter for closed fracture: Secondary | ICD-10-CM

## 2020-03-06 MED ORDER — HYDROCODONE-ACETAMINOPHEN 5-325 MG PO TABS: 1.0000 | ORAL_TABLET | Freq: Four times a day (QID) | ORAL | 0 refills | Status: DC | PRN

## 2020-03-06 MED ORDER — NAPROXEN 500 MG PO TABS: 500.0000 mg | ORAL_TABLET | Freq: Two times a day (BID) | ORAL | 0 refills | Status: DC
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
## 2020-03-06 NOTE — Discharge Instructions (Addendum)
Please schedule naproxen twice daily with food for your severe pain.  If you still have pain despite taking naproxen regularly, this is breakthrough pain.  You can use hydrocodone, a narcotic pain medicine, once every 4-6 hours for this.  Once your pain is better controlled, switch back to just naproxen.  

## 2020-09-04 ENCOUNTER — Encounter (HOSPITAL_COMMUNITY): Payer: Self-pay | Admitting: *Deleted

## 2020-09-04 ENCOUNTER — Other Ambulatory Visit: Payer: Self-pay

## 2020-09-04 ENCOUNTER — Ambulatory Visit (HOSPITAL_COMMUNITY)
Admission: EM | Admit: 2020-09-04 | Discharge: 2020-09-04 | Disposition: A | Discharge status: Home / Self Care | Payer: PRIVATE HEALTH INSURANCE | Attending: Family Medicine | Admitting: Family Medicine

## 2020-09-04 DIAGNOSIS — I10 Essential (primary) hypertension: Secondary | ICD-10-CM

## 2020-09-04 DIAGNOSIS — R7309 Other abnormal glucose: Secondary | ICD-10-CM | POA: Diagnosis not present

## 2020-09-04 LAB — CBG MONITORING, ED: Glucose-Capillary: 179 mg/dL — ABNORMAL HIGH (ref 70–99)

## 2020-09-04 MED ORDER — AMLODIPINE BESYLATE 5 MG PO TABS: 5.0000 mg | ORAL_TABLET | Freq: Every day | ORAL | 0 refills | Status: DC

## 2020-09-04 NOTE — ED Provider Notes (Signed)
[facility]    CSN: [id number] Arrival date & time: 09/04/20  1849      History   Chief Complaint Chief Complaint  Patient presents with  . Hypertension    HPI Eric Garrett is a 66 y.o. male. Pt declines translator today and is able to give me his history and verbalize understanding.  HPI   Essential hypertension: Patient reports that he has had essential hypertension for a few years.  Previously he was on amlodipine medication which he did well with but he stopped this medication as he ran out of refills.  He states that over the past 2 days he has had a mild headache and suspects that his blood pressure was elevated.  He also wishes to have his glucose levels checked today as in the past he has had slightly elevated glucose levels.  He denies any chest pain, shortness of breath, dysuria, urinary frequency.  Overall he is feeling well.  Headaches rated as mild.  He has not tried anything for symptoms.  He reports that his last meal was about 1.5-2 hours ago and consisted of pizza and milk  Past Medical History:  Diagnosis Date  . Hypertension   . Kidney stones     There are no problems to display for this patient.   History reviewed. No pertinent surgical history.     Home Medications    Prior to Admission medications   Medication Sig Start Date End Date Taking? Authorizing Provider  amLODipine (NORVASC) 5 MG tablet Take 1 tablet (5 mg total) by mouth daily. 09/04/20   Rushie Chestnut, PA-C  HYDROcodone-acetaminophen (NORCO/VICODIN) 5-325 MG tablet Take 1 tablet by mouth every 6 (six) hours as needed for severe pain. 03/06/20   Wallis Bamberg, PA-C  ibuprofen (ADVIL) 800 MG tablet Take 1 tablet (800 mg total) by mouth 3 (three) times daily. 11/27/19   Eustace Moore, MD  meloxicam (MOBIC) 7.5 MG tablet Take 1 tablet (7.5 mg total) by mouth daily. 02/12/20   Wallis Bamberg, PA-C  naproxen (NAPROSYN) 500 MG tablet Take 1 tablet (500 mg total) by mouth 2 (two)  times daily with a meal. 03/06/20   Wallis Bamberg, PA-C  tamsulosin (FLOMAX) 0.4 MG CAPS capsule Take 1 capsule (0.4 mg total) by mouth daily. 09/24/19   Wallis Bamberg, PA-C  tiZANidine (ZANAFLEX) 4 MG tablet Take 1 tablet (4 mg total) by mouth at bedtime. 02/12/20   Wallis Bamberg, PA-C    Family History Family History  Problem Relation Age of Onset  . Diabetes Mother   . Diabetes Father   . Diabetes Sister   . Diabetes Brother     Social History Social History   Tobacco Use  . Smoking status: Never Smoker  . Smokeless tobacco: Never Used  Substance Use Topics  . Alcohol use: No  . Drug use: Never     Allergies   Patient has no known allergies.   Review of Systems Review of Systems  As stated above in HPI Physical Exam Triage Vital Signs ED Triage Vitals  Enc Vitals Group     BP 09/04/20 1916 (!) 141/82     Pulse Rate 09/04/20 1916 82     Resp 09/04/20 1916 16     Temp 09/04/20 1916 (!) 97.4 F (36.3 C)     Temp Source 09/04/20 1916 Oral     SpO2 --      Weight --      Height --  Head Circumference --      Peak Flow --      Pain Score 09/04/20 1917 4     Pain Loc --      Pain Edu? --      Excl. in GC? --    No data found.  Updated Vital Signs BP (!) 141/82 (BP Location: Right Arm)   Pulse 82   Temp (!) 97.4 F (36.3 C) (Oral)   Resp 16   Physical Exam Vitals and nursing note reviewed.  Constitutional:      General: He is not in acute distress.    Appearance: Normal appearance. He is not ill-appearing, toxic-appearing or diaphoretic.  HENT:     Head: Normocephalic and atraumatic.  Eyes:     Comments: NO evidence of pallor or jaundice  Neck:     Vascular: No carotid bruit.  Cardiovascular:     Rate and Rhythm: Normal rate and regular rhythm.     Heart sounds: Normal heart sounds. No murmur heard. No gallop.      Comments: No peripheral edema Pulmonary:     Effort: Pulmonary effort is normal.     Breath sounds: Normal breath sounds.   Musculoskeletal:     Cervical back: Normal range of motion and neck supple.  Neurological:     General: No focal deficit present.     Mental Status: He is alert and oriented to person, place, and time.      UC Treatments / Results  Labs (all labs ordered are listed, but only abnormal results are displayed) Labs Reviewed  CBG MONITORING, ED - Abnormal; Notable for the following components:      Result Value   Glucose-Capillary 179 (*)    All other components within normal limits    EKG   Radiology No results found.  Procedures Procedures (including critical care time)  Medications Ordered in UC Medications - No data to display  Initial Impression / Assessment and Plan / UC Course  I have reviewed the triage vital signs and the nursing notes.  Pertinent labs & imaging results that were available during my care of the patient were reviewed by me and considered in my medical decision making (see chart for details).     Acute on chronic.  I discussed with patient that I am comfortable refilling his amlodipine medication as he has tolerated this well in the past however I did discuss that he will need to establish care with a primary care provider for additional refills.  He should reduce his salt intake and I have given him information on high blood pressure.  I also have recommended that he follow a low carbohydrate diet.  At this time I am not comfortable starting him on glucose lowering medications given his current glucose values and the fact that he does not have a primary care provider to follow-up on results.  Instead dietary adjustments will likely be sufficient in helping with his elevated glucose values. Work note given.  Final Clinical Impressions(s) / UC Diagnoses   Final diagnoses:  Elevated glucose  Essential hypertension   Discharge Instructions   None    ED Prescriptions    Medication Sig Dispense Auth. Provider   amLODipine (NORVASC) 5 MG tablet Take 1  tablet (5 mg total) by mouth daily. 90 tablet [place], [place]     PDMP not reviewed this encounter.   Rushie Chestnut, [place] 09/04/20 2017

## 2020-09-04 NOTE — ED Triage Notes (Signed)
Pt presents today because he wants his BP checked and he wants his blood sugar checked.  Pt reports he stopped his BP meds a long time . Pt also reported his DR sais he was border line diabetic.

## 2020-11-04 ENCOUNTER — Other Ambulatory Visit: Payer: Self-pay

## 2020-11-04 ENCOUNTER — Encounter (HOSPITAL_COMMUNITY): Payer: Self-pay

## 2020-11-04 ENCOUNTER — Ambulatory Visit (HOSPITAL_COMMUNITY)
Admission: EM | Admit: 2020-11-04 | Discharge: 2020-11-04 | Disposition: A | Discharge status: Home / Self Care | Payer: PRIVATE HEALTH INSURANCE | Attending: Emergency Medicine | Admitting: Emergency Medicine

## 2020-11-04 DIAGNOSIS — R739 Hyperglycemia, unspecified: Secondary | ICD-10-CM | POA: Diagnosis not present

## 2020-11-04 DIAGNOSIS — R519 Headache, unspecified: Secondary | ICD-10-CM | POA: Diagnosis not present

## 2020-11-04 DIAGNOSIS — I10 Essential (primary) hypertension: Secondary | ICD-10-CM | POA: Diagnosis not present

## 2020-11-04 LAB — CBG MONITORING, ED: Glucose-Capillary: 135 mg/dL — ABNORMAL HIGH (ref 70–99)

## 2020-11-04 MED ORDER — AMLODIPINE BESYLATE 10 MG PO TABS: 10.0000 mg | ORAL_TABLET | Freq: Every day | ORAL | 0 refills | Status: DC

## 2020-11-04 NOTE — ED Provider Notes (Signed)
[facility]    CSN: [id number] Arrival date & time: 11/04/20  1624      History   Chief Complaint Chief Complaint  Patient presents with  . Headache    HPI Eric Garrett is a 65 y.o. male.   Eric Garrett presents with concerns about his blood pressure and blood sugar. States he has been told he is borderline diabetic. He is not on any medications for this. He has been taking amlodipine 5mg  every day for his blood pressure, last prescribed from this UC. Over the past month he has had a mild headache which is intermittent. He has been observing ramadan, however. Doesn't have a PCP. No chest pain  Or shortness of breath . No dizziness. Some sensation of numbness to bilateral finger tips and toes. No leg swelling.    ROS per HPI, negative if not otherwise mentioned.      Past Medical History:  Diagnosis Date  . Hypertension   . Kidney stones     There are no problems to display for this patient.   History reviewed. No pertinent surgical history.     Home Medications    Prior to Admission medications   Medication Sig Start Date End Date Taking? Authorizing Provider  amLODipine (NORVASC) 10 MG tablet Take 1 tablet (10 mg total) by mouth daily. 11/04/20  Yes 11/06/20 B, NP  HYDROcodone-acetaminophen (NORCO/VICODIN) 5-325 MG tablet Take 1 tablet by mouth every 6 (six) hours as needed for severe pain. 03/06/20   03/08/20, PA-C  ibuprofen (ADVIL) 800 MG tablet Take 1 tablet (800 mg total) by mouth 3 (three) times daily. 11/27/19   11/29/19, MD  meloxicam (MOBIC) 7.5 MG tablet Take 1 tablet (7.5 mg total) by mouth daily. 02/12/20   04/13/20, PA-C  naproxen (NAPROSYN) 500 MG tablet Take 1 tablet (500 mg total) by mouth 2 (two) times daily with a meal. 03/06/20   03/08/20, PA-C  tamsulosin (FLOMAX) 0.4 MG CAPS capsule Take 1 capsule (0.4 mg total) by mouth daily. 09/24/19   09/26/19, PA-C  tiZANidine (ZANAFLEX) 4 MG tablet Take 1 tablet (4  mg total) by mouth at bedtime. 02/12/20   04/13/20, PA-C    Family History Family History  Problem Relation Age of Onset  . Diabetes Mother   . Diabetes Father   . Diabetes Sister   . Diabetes Brother     Social History Social History   Tobacco Use  . Smoking status: Never Smoker  . Smokeless tobacco: Never Used  Substance Use Topics  . Alcohol use: No  . Drug use: Never     Allergies   Patient has no known allergies.   Review of Systems Review of Systems   Physical Exam Triage Vital Signs ED Triage Vitals  Enc Vitals Group     BP 11/04/20 1658 (!) 142/80     Pulse Rate 11/04/20 1658 61     Resp 11/04/20 1658 18     Temp 11/04/20 1658 98.4 F (36.9 C)     Temp Source 11/04/20 1658 Oral     SpO2 11/04/20 1658 100 %     Weight --      Height --      Head Circumference --      Peak Flow --      Pain Score 11/04/20 1657 2     Pain Loc --      Pain Edu? --      Excl.  in GC? --    No data found.  Updated Vital Signs BP (!) 142/80 (BP Location: Right Arm)   Pulse 61   Temp 98.4 F (36.9 C) (Oral)   Resp 18   SpO2 100%   Visual Acuity Right Eye Distance:   Left Eye Distance:   Bilateral Distance:    Right Eye Near:   Left Eye Near:    Bilateral Near:     Physical Exam   UC Treatments / Results  Labs (all labs ordered are listed, but only abnormal results are displayed) Labs Reviewed  CBG MONITORING, ED - Abnormal; Notable for the following components:      Result Value   Glucose-Capillary 135 (*)    All other components within normal limits    EKG   Radiology No results found.  Procedures Procedures (including critical care time)  Medications Ordered in UC Medications - No data to display  Initial Impression / Assessment and Plan / UC Course  I have reviewed the triage vital signs and the nursing notes.  Pertinent labs & imaging results that were available during my care of the patient were reviewed by me and considered in my  medical decision making (see chart for details).     Blood pressure still mildly elevated here tonight despite taking amlodipine 5mg , increased to 10mg . Headache related to blood pressure vs fasting? Blood sugar today 135, therefore if this is fasting he definitely needs recheck as likely diabetic. Emphasized follow up with PCP for recheck and management of medications. Return for bp recheck if unable to get in with PCP. Return precautions provided. Patient verbalized understanding and agreeable to plan.   Final Clinical Impressions(s) / UC Diagnoses   Final diagnoses:  Acute nonintractable headache, unspecified headache type  Hypertension, unspecified type  Elevated blood sugar     Discharge Instructions     I do feel that we can increase your blood pressure medication given that your blood pressure is still slightly elevated.  Your headaches may be from this blood pressure, fasting could be contributing to your headache as well, so once you have a regular diet.  Your blood sugar is still elevated and borderline given that you are fasting.  Please establish with a primary care provider for recheck of this to determine if you will need medications started.  See nutrition education provided to try to improve your blood sugars.  Return in two weeks for a Blood pressure recheck.    ED Prescriptions    Medication Sig Dispense Auth. Provider   amLODipine (NORVASC) 10 MG tablet Take 1 tablet (10 mg total) by mouth daily. 30 tablet , NP     PDMP not reviewed this encounter.   , NP 11/04/20 2047

## 2020-11-04 NOTE — ED Triage Notes (Signed)
Pt reports headache x 1 month; numbness in the toes x 2 months.

## 2020-11-04 NOTE — Discharge Instructions (Addendum)
I do feel that we can increase your blood pressure medication given that your blood pressure is still slightly elevated.  Your headaches may be from this blood pressure, fasting could be contributing to your headache as well, so once you have a regular diet.  Your blood sugar is still elevated and borderline given that you are fasting.  Please establish with a primary care provider for recheck of this to determine if you will need medications started.  See nutrition education provided to try to improve your blood sugars.  Return in two weeks for a Blood pressure recheck.

## 2020-11-11 ENCOUNTER — Encounter: Payer: Self-pay | Admitting: *Deleted

## 2020-11-20 ENCOUNTER — Ambulatory Visit (HOSPITAL_COMMUNITY)
Admission: EM | Admit: 2020-11-20 | Discharge: 2020-11-20 | Disposition: A | Discharge status: Home / Self Care | Payer: PRIVATE HEALTH INSURANCE | Attending: Physician Assistant | Admitting: Physician Assistant

## 2020-11-20 ENCOUNTER — Other Ambulatory Visit: Payer: Self-pay

## 2020-11-20 ENCOUNTER — Encounter (HOSPITAL_COMMUNITY): Payer: Self-pay

## 2020-11-20 DIAGNOSIS — I10 Essential (primary) hypertension: Secondary | ICD-10-CM | POA: Diagnosis present

## 2020-11-20 DIAGNOSIS — R519 Headache, unspecified: Secondary | ICD-10-CM | POA: Diagnosis present

## 2020-11-20 DIAGNOSIS — R7303 Prediabetes: Secondary | ICD-10-CM | POA: Diagnosis present

## 2020-11-20 DIAGNOSIS — G8929 Other chronic pain: Secondary | ICD-10-CM

## 2020-11-20 LAB — POCT URINALYSIS DIPSTICK, ED / UC
Bilirubin Urine: NEGATIVE
Glucose, UA: NEGATIVE mg/dL
Ketones, ur: NEGATIVE mg/dL
Leukocytes,Ua: NEGATIVE
Nitrite: NEGATIVE
Protein, ur: NEGATIVE mg/dL
Specific Gravity, Urine: 1.025 (ref 1.005–1.030)
Urobilinogen, UA: 0.2 mg/dL (ref 0.0–1.0)
pH: 5 (ref 5.0–8.0)

## 2020-11-20 LAB — CBG MONITORING, ED: Glucose-Capillary: 146 mg/dL — ABNORMAL HIGH (ref 70–99)

## 2020-11-20 MED ORDER — AMLODIPINE BESYLATE 10 MG PO TABS: 10.0000 mg | ORAL_TABLET | Freq: Every day | ORAL | 0 refills | Status: DC

## 2020-11-20 NOTE — ED Triage Notes (Signed)
Pt in requesting work note  States he was seen on 4/25 for headaches and prescribed medication that did not help so he did not got to work for 1 week and is requesting a note for the week he missed out of work

## 2020-11-20 NOTE — ED Provider Notes (Signed)
[facility]    CSN: [id number] Arrival date & time: 11/20/20  1644      History   Chief Complaint Chief Complaint  Patient presents with  . Work note    HPI Eric Garrett is a 65 y.o. male.   Patient presents today for follow-up of headaches.  He declined video interpreter.  He was seen 11/04/2020 at which point headaches were attributed to elevated blood pressure and amlodipine was increased from 5 to 10 mg daily.  Patient reports taking two 10 mg tablets which worsen symptoms and is since returned to taking only one 10 mg tablet with improvement.  He reports headache pain is 4 on a 0-10 pain scale, localized to neck with radiation into occiput, described as aching/tightness, no aggravating relieving factors identified.  He has been trying ibuprofen without improvement of symptoms.  Denies additional medication changes prior to symptom onset.  He has been out of work for the last week as result of symptoms and is requesting work excuse note today.  Denies any vision changes, seizure activity, focal weakness recent illness.  Reports being borderline diabetic and wonders if blood sugars could be contributing to symptoms.  He does not monitor them at home and does not take any medications to treat this condition.     Past Medical History:  Diagnosis Date  . Hypertension   . Kidney stones     There are no problems to display for this patient.   History reviewed. No pertinent surgical history.     Home Medications    Prior to Admission medications   Medication Sig Start Date End Date Taking? Authorizing Provider  amLODipine (NORVASC) 10 MG tablet Take 1 tablet (10 mg total) by mouth daily. 11/20/20   Chivon Lepage, Noberto Retort, PA-C  HYDROcodone-acetaminophen (NORCO/VICODIN) 5-325 MG tablet Take 1 tablet by mouth every 6 (six) hours as needed for severe pain. 03/06/20   Wallis Bamberg, PA-C  ibuprofen (ADVIL) 800 MG tablet Take 1 tablet (800 mg total) by mouth 3 (three) times  daily. 11/27/19   Eustace Moore, MD  meloxicam (MOBIC) 7.5 MG tablet Take 1 tablet (7.5 mg total) by mouth daily. 02/12/20   Wallis Bamberg, PA-C  naproxen (NAPROSYN) 500 MG tablet Take 1 tablet (500 mg total) by mouth 2 (two) times daily with a meal. 03/06/20   Wallis Bamberg, PA-C  tamsulosin (FLOMAX) 0.4 MG CAPS capsule Take 1 capsule (0.4 mg total) by mouth daily. 09/24/19   Wallis Bamberg, PA-C  tiZANidine (ZANAFLEX) 4 MG tablet Take 1 tablet (4 mg total) by mouth at bedtime. 02/12/20   Wallis Bamberg, PA-C    Family History Family History  Problem Relation Age of Onset  . Diabetes Mother   . Diabetes Father   . Diabetes Sister   . Diabetes Brother     Social History Social History   Tobacco Use  . Smoking status: Never Smoker  . Smokeless tobacco: Never Used  Substance Use Topics  . Alcohol use: No  . Drug use: Never     Allergies   Patient has no known allergies.   Review of Systems Review of Systems  Constitutional: Positive for activity change. Negative for appetite change, fatigue and fever.  Eyes: Negative for visual disturbance.  Respiratory: Negative for cough and shortness of breath.   Cardiovascular: Negative for chest pain.  Gastrointestinal: Negative for abdominal pain, diarrhea, nausea and vomiting.  Musculoskeletal: Positive for neck pain. Negative for arthralgias and myalgias.  Neurological: Positive for  headaches. Negative for dizziness and light-headedness.     Physical Exam Triage Vital Signs ED Triage Vitals  Enc Vitals Group     BP 11/20/20 1741 133/79     Pulse Rate 11/20/20 1740 72     Resp 11/20/20 1740 18     Temp 11/20/20 1740 97.6 F (36.4 C)     Temp Source 11/20/20 1740 Oral     SpO2 11/20/20 1740 95 %     Weight --      Height --      Head Circumference --      Peak Flow --      Pain Score 11/20/20 1738 4     Pain Loc --      Pain Edu? --      Excl. in GC? --    No data found.  Updated Vital Signs BP 133/79   Pulse 72   Temp  97.6 F (36.4 C) (Oral)   Resp 18   SpO2 95%   Visual Acuity Right Eye Distance:   Left Eye Distance:   Bilateral Distance:    Right Eye Near:   Left Eye Near:    Bilateral Near:     Physical Exam Vitals reviewed.  Constitutional:      General: He is awake.     Appearance: Normal appearance. He is normal weight. He is not ill-appearing.     Comments: Very pleasant male appears stated age in no acute distress  HENT:     Head: Normocephalic and atraumatic.     Right Ear: Tympanic membrane, ear canal and external ear normal. Tympanic membrane is not erythematous or bulging.     Left Ear: Tympanic membrane, ear canal and external ear normal. Tympanic membrane is not erythematous or bulging.     Nose: Nose normal.     Mouth/Throat:     Tongue: Tongue does not deviate from midline.     Pharynx: Uvula midline. No oropharyngeal exudate or posterior oropharyngeal erythema.  Eyes:     Extraocular Movements: Extraocular movements intact.     Pupils: Pupils are equal, round, and reactive to light.  Cardiovascular:     Rate and Rhythm: Normal rate and regular rhythm.     Heart sounds: No murmur heard.   Pulmonary:     Effort: Pulmonary effort is normal. No accessory muscle usage or respiratory distress.     Breath sounds: Normal breath sounds. No stridor. No wheezing, rhonchi or rales.     Comments: Clear to auscultation bilaterally Abdominal:     General: Bowel sounds are normal.     Palpations: Abdomen is soft.     Tenderness: There is no abdominal tenderness.  Musculoskeletal:     Cervical back: Normal range of motion and neck supple. No spinous process tenderness or muscular tenderness.  Lymphadenopathy:     Head:     Right side of head: No submental, submandibular or tonsillar adenopathy.     Left side of head: No submental, submandibular or tonsillar adenopathy.     Cervical: No cervical adenopathy.  Neurological:     General: No focal deficit present.     Mental Status:  He is alert.     Motor: Motor function is intact.     Coordination: Coordination is intact.     Gait: Gait is intact.     Comments: Cranial nerves II through XII intact.  No focal neurological defect on exam.  Psychiatric:        Behavior:  Behavior is cooperative.      UC Treatments / Results  Labs (all labs ordered are listed, but only abnormal results are displayed) Labs Reviewed  POCT URINALYSIS DIPSTICK, ED / UC - Abnormal; Notable for the following components:      Result Value   Hgb urine dipstick TRACE (*)    All other components within normal limits  CBG MONITORING, ED - Abnormal; Notable for the following components:   Glucose-Capillary 146 (*)    All other components within normal limits  URINE CULTURE    EKG   Radiology No results found.  Procedures Procedures (including critical care time)  Medications Ordered in UC Medications - No data to display  Initial Impression / Assessment and Plan / UC Course  I have reviewed the triage vital signs and the nursing notes.  Pertinent labs & imaging results that were available during my care of the patient were reviewed by me and considered in my medical decision making (see chart for details).     Blood pressure is at goal so we will continue amlodipine 10 mg as previously prescribed.  Refill was provided as requested.  Capillary glucose was 146 which is expected given patient is not fasting.  He was encouraged to continue drinking plenty of fluid and eat small frequent meals.  Discussed the importance of following up with PCP for ongoing management.  He can use over-the-counter analgesics and previously prescribed muscle relaxers for pain relief.  We are unable to provide him with a work excuse note excusing him from previous absences and patient expressed understanding.  Strict return precautions given to which patient expressed understanding.  Final Clinical Impressions(s) / UC Diagnoses   Final diagnoses:   Chronic nonintractable headache, unspecified headache type  Essential hypertension  Prediabetes     Discharge Instructions     Continue amlodipine 10 mg as previously prescribed.  Your blood sugar is normal so we will continue to monitor this for the time being.  Use ibuprofen and muscle relaxers previously prescribed to manage headache.  Follow-up with primary care provider as we discussed.  I am unable to give you a note excusing absences that occurred earlier in the week.    ED Prescriptions    Medication Sig Dispense Auth. Provider   amLODipine (NORVASC) 10 MG tablet Take 1 tablet (10 mg total) by mouth daily. 30 tablet Haneef Hallquist, Noberto Retort, PA-C     PDMP not reviewed this encounter.   Jeani Hawking, PA-C 11/20/20 1830

## 2020-11-20 NOTE — Discharge Instructions (Signed)
Continue amlodipine 10 mg as previously prescribed.  Your blood sugar is normal so we will continue to monitor this for the time being.  Use ibuprofen and muscle relaxers previously prescribed to manage headache.  Follow-up with primary care provider as we discussed.  I am unable to give you a note excusing absences that occurred earlier in the week.

## 2020-11-22 LAB — URINE CULTURE: Culture: <10000 — AB
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
## 2022-02-02 ENCOUNTER — Encounter (HOSPITAL_COMMUNITY): Payer: Self-pay

## 2022-02-02 ENCOUNTER — Ambulatory Visit (HOSPITAL_COMMUNITY)
Admission: EM | Admit: 2022-02-02 | Discharge: 2022-02-02 | Disposition: A | Discharge status: Home / Self Care | Payer: PRIVATE HEALTH INSURANCE | Attending: Emergency Medicine | Admitting: Emergency Medicine

## 2022-02-02 DIAGNOSIS — M79662 Pain in left lower leg: Secondary | ICD-10-CM

## 2022-02-02 DIAGNOSIS — S46812A Strain of other muscles, fascia and tendons at shoulder and upper arm level, left arm, initial encounter: Secondary | ICD-10-CM | POA: Diagnosis not present

## 2022-02-02 DIAGNOSIS — M25512 Pain in left shoulder: Secondary | ICD-10-CM

## 2022-02-02 MED ORDER — TIZANIDINE HCL 4 MG PO TABS: 4.0000 mg | ORAL_TABLET | Freq: Every day | ORAL | 0 refills | Status: DC

## 2022-02-02 MED ORDER — MELOXICAM 7.5 MG PO TABS: 7.5000 mg | ORAL_TABLET | Freq: Every day | ORAL | 0 refills | Status: DC

## 2022-02-02 NOTE — Discharge Instructions (Addendum)
Your pain is most likely caused by irritation to the muscles or ligaments.   You may use meloxicam every morning as needed for pain, this will help to reduce any inflammation that may be aiding to your discomfort  You may use Zanaflex at bedtime as needed for pain to help you rest, be mindful this medication may make you feel drowsy  You may use heating pad in 15 minute intervals as needed for additional comfort, or  you may find comfort in using ice in 10-15 minutes over affected area  Begin stretching affected area daily for 10 minutes as tolerated to further loosen muscles   When lying down place pillow underneath and between knees for support  Can try sleeping without pillow on firm mattress   If pain persist after recommended treatment or reoccurs if may be beneficial to follow up with orthopedic specialist for evaluation, this doctor specializes in the bones and can manage your symptoms long-term with options such as but not limited to imaging, medications or physical therapy
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
## 2022-09-16 ENCOUNTER — Ambulatory Visit (HOSPITAL_COMMUNITY)
Admission: EM | Admit: 2022-09-16 | Discharge: 2022-09-16 | Disposition: A | Discharge status: Home / Self Care | Payer: Self-pay | Attending: Emergency Medicine | Admitting: Emergency Medicine

## 2022-09-16 ENCOUNTER — Encounter (HOSPITAL_COMMUNITY): Payer: Self-pay

## 2022-09-16 DIAGNOSIS — I10 Essential (primary) hypertension: Secondary | ICD-10-CM

## 2022-09-16 DIAGNOSIS — M79671 Pain in right foot: Secondary | ICD-10-CM

## 2022-09-16 DIAGNOSIS — R739 Hyperglycemia, unspecified: Secondary | ICD-10-CM

## 2022-09-16 DIAGNOSIS — M542 Cervicalgia: Secondary | ICD-10-CM

## 2022-09-16 HISTORY — DX: Type 2 diabetes mellitus without complications: E11.9

## 2022-09-16 LAB — CBG MONITORING, ED: Glucose-Capillary: 122 mg/dL — ABNORMAL HIGH (ref 70–99)

## 2022-09-16 MED ORDER — METHOCARBAMOL 500 MG PO TABS: 500.0000 mg | ORAL_TABLET | Freq: Two times a day (BID) | ORAL | 0 refills | Status: DC

## 2022-09-16 MED ORDER — AMLODIPINE BESYLATE 10 MG PO TABS: 10.0000 mg | ORAL_TABLET | Freq: Every day | ORAL | 0 refills | Status: DC

## 2022-09-16 MED ORDER — NAPROXEN 500 MG PO TABS: 500.0000 mg | ORAL_TABLET | Freq: Two times a day (BID) | ORAL | 0 refills | Status: AC

## 2022-09-16 NOTE — ED Triage Notes (Signed)
Elevated blood pressure, elevated blood sugar, neck and foot pain. Patient is out of medications for blood pressure.

## 2022-09-16 NOTE — ED Provider Notes (Signed)
[place]    CSN: [id number] Arrival date & time: 09/16/22  1641      History   Chief Complaint Chief Complaint  Patient presents with   Blood Sugar Problem   Hypertension    HPI Eric Garrett is a 67 y.o. male.   Patient presents to clinic for complaints of HTN, hyperglycemia, these has been ongoing.   Today he feels fatigue, joint pain and aches. Denies fevers. The joint pains started Sunday. Denies cough. Reports right sided neck pain and stiffness, bilateral knee and ankle pain. Denies falls or trauma. Denies recent sick contacts.   Patient is out of his 10 mg amlodipine, has been out for over 6 months.   The history is provided by the patient. The history is limited by a language barrier. A language interpreter was used.  Hypertension This is a chronic problem. Pertinent negatives include no chest pain, no abdominal pain, no headaches and no shortness of breath.    Past Medical History:  Diagnosis Date   Diabetes mellitus without complication ([place])    Hypertension    Kidney stones     There are no problems to display for this patient.   History reviewed. No pertinent surgical history.     Home Medications    Prior to Admission medications   Medication Sig Start Date End Date Taking? Authorizing Provider  methocarbamol (ROBAXIN) 500 MG tablet Take 1 tablet (500 mg total) by mouth 2 (two) times daily. 09/16/22  Yes Louretta Shorten, [place] N, FNP  amLODipine (NORVASC) 10 MG tablet Take 1 tablet (10 mg total) by mouth daily. 09/16/22 10/16/22  Lyndsay Talamante, [place], FNP  HYDROcodone-acetaminophen (NORCO/VICODIN) 5-325 MG tablet Take 1 tablet by mouth every 6 (six) hours as needed for severe pain. 03/06/20   Jaynee Eagles, PA-C  naproxen (NAPROSYN) 500 MG tablet Take 1 tablet (500 mg total) by mouth 2 (two) times daily with a meal for 10 days. 09/16/22 09/26/22  Wandra Babin, [place], FNP  tamsulosin (FLOMAX) 0.4 MG CAPS capsule Take 1 capsule (0.4 mg total) by mouth  daily. 09/24/19   Jaynee Eagles, PA-C  tiZANidine (ZANAFLEX) 4 MG tablet Take 1 tablet (4 mg total) by mouth at bedtime. 02/02/22   Hans Eden, NP    Family History Family History  Problem Relation Age of Onset   Diabetes Mother    Diabetes Father    Diabetes Sister    Diabetes Brother     Social History Social History   Tobacco Use   Smoking status: Never   Smokeless tobacco: Never  Vaping Use   Vaping Use: Never used  Substance Use Topics   Alcohol use: No   Drug use: Never     Allergies   Patient has no known allergies.   Review of Systems Review of Systems  Constitutional:  Negative for chills and fever.  HENT:  Negative for ear pain and sore throat.   Eyes:  Negative for pain and visual disturbance.  Respiratory:  Negative for cough and shortness of breath.   Cardiovascular:  Negative for chest pain and palpitations.  Gastrointestinal:  Negative for abdominal pain and vomiting.  Genitourinary:  Negative for dysuria and hematuria.  Musculoskeletal:  Positive for arthralgias, myalgias and neck pain. Negative for back pain and joint swelling.  Skin:  Negative for color change and rash.  Neurological:  Negative for seizures, syncope, facial asymmetry, weakness and headaches.  All other systems reviewed and are negative.    Physical Exam Triage  Vital Signs ED Triage Vitals  Enc Vitals Group     BP 09/16/22 1733 (!) 173/76     Pulse Rate 09/16/22 1733 62     Resp 09/16/22 1733 16     Temp 09/16/22 1733 97.9 F (36.6 C)     Temp Source 09/16/22 1733 Oral     SpO2 09/16/22 1733 100 %     Weight 09/16/22 1732 205 lb 0.4 oz (93 kg)     Height 09/16/22 1732 '6\' 1"'$  (1.854 m)     Head Circumference --      Peak Flow --      Pain Score 09/16/22 1730 4     Pain Loc --      Pain Edu? --      Excl. in [place]? --    No data found.  Updated Vital Signs BP (!) 173/76 (BP Location: Right Wrist)   Pulse 62   Temp 97.9 F (36.6 C) (Oral)   Resp 16   Ht '6\' 1"'$   (1.854 m)   Wt 205 lb 0.4 oz (93 kg)   SpO2 100%   BMI 27.05 kg/m   Visual Acuity Right Eye Distance:   Left Eye Distance:   Bilateral Distance:    Right Eye Near:   Left Eye Near:    Bilateral Near:     Physical Exam Vitals and nursing note reviewed.  Constitutional:      General: He is not in acute distress.    Appearance: Normal appearance. He is well-developed.  HENT:     Head: Normocephalic and atraumatic.     Right Ear: External ear normal.     Left Ear: External ear normal.     Nose: Nose normal.     Mouth/Throat:     Mouth: Mucous membranes are moist.  Eyes:     General: Lids are normal. No scleral icterus.       Right eye: No discharge.        Left eye: No discharge.     Conjunctiva/sclera: Conjunctivae normal.     Pupils: Pupils are equal, round, and reactive to light.  Cardiovascular:     Rate and Rhythm: Normal rate and regular rhythm.     Pulses:          Dorsalis pedis pulses are 2+ on the right side and 2+ on the left side.       Posterior tibial pulses are 2+ on the right side and 2+ on the left side.     Heart sounds: Normal heart sounds, S1 normal and S2 normal. No murmur heard. Pulmonary:     Effort: Pulmonary effort is normal. No respiratory distress.     Breath sounds: Normal breath sounds.     Comments: Lungs vesicular posteriorly. Musculoskeletal:        General: No swelling, tenderness, deformity or signs of injury. Normal range of motion.     Cervical back: Normal range of motion and neck supple. No tenderness.     Right lower leg: No edema.     Left lower leg: No edema.     Right foot: No deformity.     Left foot: No deformity.  Feet:     Right foot:     Skin integrity: Skin integrity normal.     Left foot:     Skin integrity: Skin integrity normal.     Comments: Reports foot pain after prolonged standing, dorsalis pedis 2+, with edema.  Strength 5/5 bilaterally.  Active  range of motion intact. Skin:    General: Skin is warm and dry.      Capillary Refill: Capillary refill takes less than 2 seconds.  Neurological:     General: No focal deficit present.     Mental Status: He is alert and oriented to person, place, and time.  Psychiatric:        Mood and Affect: Mood normal.        Behavior: Behavior is cooperative.      UC Treatments / Results  Labs (all labs ordered are listed, but only abnormal results are displayed) Labs Reviewed  CBG MONITORING, ED - Abnormal; Notable for the following components:      Result Value   Glucose-Capillary 122 (*)    All other components within normal limits    EKG   Radiology No results found.  Procedures Procedures (including critical care time)  Medications Ordered in UC Medications - No data to display  Initial Impression / Assessment and Plan / UC Course  I have reviewed the triage vital signs and the nursing notes.  Pertinent labs & imaging results that were available during my care of the patient were reviewed by me and considered in my medical decision making (see chart for details).  Vitals and triage reviewed, patient is hemodynamically stable.   Vital signs triage reviewed, patient is hemodynamically stable.  No signs of hypertensive emergency.  Denies changes in vision or headache.  Will restart on 10 mg amlodipine and encouraged follow-up with primary care provider.  Unclear etiology of right-sided neck pain.  Without tenderness to palpation.  Pain is reproducible with movement.  Without limitations to range of motion, afebrile.  Low concern for infectious process.  Will trial muscle relaxers and anti-inflammatories.  CBG 122 in clinic, will withhold starting on diabetic medication, will leave this up to primary care provider after further management and evaluation.  Discussed that joint pain could be a manifestation of his other chronic illnesses, or could be sign of a viral illness.  Patient without cough, sore throat.  Declines viral testing in clinic.   Advised to treat symptomatically with anti-inflammatories.  Plan of care, return precautions, and emergency care discussed, no questions at this time.    Final Clinical Impressions(s) / UC Diagnoses   Final diagnoses:  Essential hypertension  Neck pain on right side  Foot pain, bilateral  Hyperglycemia     Discharge Instructions      ??? ????? ????? ?? ??? ??????? ???? ???? ?????? ??? ??? ??????? ????? ????? ???? ????? ??????. ????? ?????? ?????? ??? ????????? ????? ??. ???? ????? ??? ???? ?? ???? ???? ??????. ??? ???? ?? 30 ?????? ???? ??????? ?? ???? ??????? ??????? ??? ????? ?? ????? ??? ??? ???? ???? ??????? ????????.  ??????? ???? ???? ??? ?????? ????? ?????? ???? ???????? ???? ??????? ??? ????? ????? ??? ????? ?????? ?? ????? ?????? ?????? ?? ???? ????????? ???? ???? ???. ????? ??? ?? ????? ?????? ????.  ??? ??????? ?? ??? ??? ????? ?????. ?? ???? ??? ???? ?????? ???????? ??? ?????? ????? ?????? ??????? ??????? ????????. ???? ??? ??? ??? ????? ??????? ????. ?? ???? ?? ???? ?? ???? ??????? ??? Robaxin ???? ?? ????? ???? ???????.  ???? ??? ??????? ??????? ??? ??? ????? ?? ?????? ?? ????? ???? ??????? ?? ?????? ??? ???? ??? ???????? ?? ????? ???????.      ED Prescriptions     Medication Sig Dispense Auth. Provider   amLODipine (NORVASC) 10 MG tablet Take 1 tablet (10  mg total) by mouth daily. 30 tablet Louretta Shorten, [place], [place]   naproxen (NAPROSYN) 500 MG tablet Take 1 tablet (500 mg total) by mouth 2 (two) times daily with a meal for 10 days. 20 tablet Louretta Shorten, [place], [place]   methocarbamol (ROBAXIN) 500 MG tablet Take 1 tablet (500 mg total) by mouth 2 (two) times daily. 20 tablet Christpher Stogsdill, [place] N, [place]      I have reviewed the PDMP during this encounter.   Nitika Jackowski, [place] N, [place] 09/16/22 1919

## 2022-09-16 NOTE — Discharge Instructions (Addendum)
??? ????? ????? ?? ??? ??????? ???? ???? ?????? ??? ??? ??????? ????? ????? ???? ????? ??????. ????? ?????? ?????? ??? ????????? ????? ??. ???? ????? ??? ???? ?? ???? ???? ??????. ??? ???? ??   30 ?????? ???? ??????? ?? ???? ??????? ??????? ??? ????? ?? ????? ??? ??? ???? ???? ??????? ????????.  ??????? ???? ???? ??? ?????? ????? ?????? ???? ???????? ???? ??????? ??? ????? ????? ??? ????? ?????? ?? ????? ?????? ?????? ?? ???? ????????? ???? ???? ???. ????? ??? ?? ????? ?????? ????.  ??? ??????? ?? ??? ??? ????? ?????. ?? ???? ??? ???? ?????? ???????? ??? ?????? ????? ?????? ??????? ??????? ????????. ???? ??? ??? ??? ????? ??????? ????. ?? ???? ?? ???? ?? ???? ??????? ??? Robaxin ???? ?? ????? ???? ???????.  ???? ??? ??????? ??????? ??? ??? ????? ?? ?????? ?? ????? ???? ??????? ?? ?????? ??? ???? ??? ???????? ?? ????? ???????.

## 2023-10-22 ENCOUNTER — Encounter (HOSPITAL_COMMUNITY): Payer: Self-pay

## 2023-10-22 ENCOUNTER — Ambulatory Visit (HOSPITAL_COMMUNITY)
Admission: EM | Admit: 2023-10-22 | Discharge: 2023-10-22 | Disposition: A | Discharge status: Home / Self Care | Attending: Physician Assistant | Admitting: Physician Assistant

## 2023-10-22 DIAGNOSIS — M25512 Pain in left shoulder: Secondary | ICD-10-CM

## 2023-10-22 DIAGNOSIS — I10 Essential (primary) hypertension: Secondary | ICD-10-CM

## 2023-10-22 DIAGNOSIS — G8929 Other chronic pain: Secondary | ICD-10-CM

## 2023-10-22 LAB — POCT FASTING CBG KUC MANUAL ENTRY: POCT Glucose (KUC): 153 mg/dL — AB (ref 70–99)

## 2023-10-22 LAB — CBG MONITORING, ED

## 2023-10-22 MED ORDER — AMLODIPINE BESYLATE 5 MG PO TABS: 5.0000 mg | ORAL_TABLET | Freq: Every day | ORAL | 11 refills | Status: DC

## 2023-10-22 MED ORDER — METHOCARBAMOL 500 MG PO TABS: 500.0000 mg | ORAL_TABLET | Freq: Three times a day (TID) | ORAL | 0 refills | Status: DC | PRN

## 2023-10-22 NOTE — ED Notes (Signed)
 CBG was 153 but I don't see where my result showed up.

## 2023-10-22 NOTE — ED Provider Notes (Signed)
 [facility]    CSN: [id number] Arrival date & time: 10/22/23  1742      History   Chief Complaint Chief Complaint  Patient presents with   Pain    HPI Eric Garrett is a 68 y.o. male.   Pt complains of pain in his left hip, left shoulder neck and back.  Pt reports he has had this pain for the past 15 years on and off.  Pt reports this is the same pain.  Pt is also out of his blood pressure medications.  Pt does not have a primary care MD.  Pt states he has diabetes but does not take any medicine for diabets.   The history is provided by the patient. The history is limited by a language barrier.    Past Medical History:  Diagnosis Date   Diabetes mellitus without complication ([facility])    Hypertension    Kidney stones     There are no active problems to display for this patient.   History reviewed. No pertinent surgical history.     Home Medications    Prior to Admission medications   Medication Sig Start Date End Date Taking? Authorizing Provider  amLODipine (NORVASC) 10 MG tablet Take 1 tablet (10 mg total) by mouth daily. 09/16/22 10/22/23  Garrison, [place] N, FNP  HYDROcodone-acetaminophen (NORCO/VICODIN) 5-325 MG tablet Take 1 tablet by mouth every 6 (six) hours as needed for severe pain. 03/06/20   Wallis Bamberg, PA-C  methocarbamol (ROBAXIN) 500 MG tablet Take 1 tablet (500 mg total) by mouth 2 (two) times daily. 09/16/22   Garrison, [place] N, FNP  tamsulosin (FLOMAX) 0.4 MG CAPS capsule Take 1 capsule (0.4 mg total) by mouth daily. 09/24/19   Wallis Bamberg, PA-C  tiZANidine (ZANAFLEX) 4 MG tablet Take 1 tablet (4 mg total) by mouth at bedtime. 02/02/22   Valinda Hoar, NP    Family History Family History  Problem Relation Age of Onset   Diabetes Mother    Diabetes Father    Diabetes Sister    Diabetes Brother     Social History Social History   Tobacco Use   Smoking status: Never   Smokeless tobacco: Never  Vaping Use   Vaping status: Never  Used  Substance Use Topics   Alcohol use: No   Drug use: Never     Allergies   Patient has no known allergies.   Review of Systems Review of Systems  All other systems reviewed and are negative.    Physical Exam Triage Vital Signs ED Triage Vitals  Encounter Vitals Group     BP 10/22/23 1923 (!) 195/88     Systolic BP Percentile --      Diastolic BP Percentile --      Pulse Rate 10/22/23 1923 63     Resp 10/22/23 1923 16     Temp 10/22/23 1923 98.3 F (36.8 C)     Temp Source 10/22/23 1923 Oral     SpO2 10/22/23 1923 98 %     Weight --      Height --      Head Circumference --      Peak Flow --      Pain Score 10/22/23 1917 3     Pain Loc --      Pain Education --      Exclude from Growth Chart --    No data found.  Updated Vital Signs BP (!) 228/97 (BP Location: Right Arm)  Pulse 63   Temp 98.3 F (36.8 C) (Oral)   Resp 16   SpO2 98%   Visual Acuity Right Eye Distance:   Left Eye Distance:   Bilateral Distance:    Right Eye Near:   Left Eye Near:    Bilateral Near:     Physical Exam Vitals and nursing note reviewed.  Constitutional:      Appearance: He is well-developed.  HENT:     Head: Normocephalic.     Mouth/Throat:     Mouth: Mucous membranes are moist.  Eyes:     Pupils: Pupils are equal, round, and reactive to light.  Cardiovascular:     Rate and Rhythm: Normal rate.  Pulmonary:     Effort: Pulmonary effort is normal.  Abdominal:     General: There is no distension.  Musculoskeletal:        General: Normal range of motion.     Cervical back: Normal range of motion.  Skin:    General: Skin is warm.  Neurological:     General: No focal deficit present.     Mental Status: He is alert and oriented to person, place, and time.  Psychiatric:        Mood and Affect: Mood normal.      UC Treatments / Results  Labs (all labs ordered are listed, but only abnormal results are displayed) Labs Reviewed  CBG MONITORING, ED     EKG   Radiology No results found.  Procedures Procedures (including critical care time)  Medications Ordered in UC Medications - No data to display  Initial Impression / Assessment and Plan / UC Course  I have reviewed the triage vital signs and the nursing notes.  Pertinent labs & imaging results that were available during my care of the patient were reviewed by me and considered in my medical decision making (see chart for details).     Glucose is 153.  Pt has elevated blood pressure.  Pt given rx for norvasc and robaxin.  Pt advised he needs primary care evaltuion and treatment  Final Clinical Impressions(s) / UC Diagnoses   Final diagnoses:  Chronic left shoulder pain  Essential hypertension   Discharge Instructions   None    ED Prescriptions     Medication Sig Dispense Auth. Provider   amLODipine (NORVASC) 5 MG tablet Take 1 tablet (5 mg total) by mouth daily. 30 tablet Sinahi Knights K, [place]   methocarbamol (ROBAXIN) 500 MG tablet Take 1 tablet (500 mg total) by mouth every 8 (eight) hours as needed for muscle spasms. 20 tablet Elson Areas, [place]      PDMP not reviewed this encounter. An After Visit Summary was printed and given to the patient.       Elson Areas, [place] 10/22/23 2013

## 2023-10-22 NOTE — ED Triage Notes (Signed)
 Patient presenting with neck and left shoulder pain ongoing for a "long time". Also having left hip pain ongoing for around 15 years.  Patient denies any recent changes to his chronic pain.  Prescriptions or OTC medications tried: No

## 2023-10-22 NOTE — Discharge Instructions (Addendum)
 You need to see primary care for medical mangement of your blood pressure

## 2023-11-02 ENCOUNTER — Ambulatory Visit (HOSPITAL_COMMUNITY)
Admission: EM | Admit: 2023-11-02 | Discharge: 2023-11-02 | Disposition: A | Discharge status: Home / Self Care | Attending: Emergency Medicine | Admitting: Emergency Medicine

## 2023-11-02 ENCOUNTER — Encounter (HOSPITAL_COMMUNITY): Payer: Self-pay | Admitting: Emergency Medicine

## 2023-11-02 DIAGNOSIS — G8929 Other chronic pain: Secondary | ICD-10-CM

## 2023-11-02 DIAGNOSIS — M25512 Pain in left shoulder: Secondary | ICD-10-CM

## 2023-11-02 DIAGNOSIS — M25552 Pain in left hip: Secondary | ICD-10-CM

## 2023-11-02 MED ORDER — MELOXICAM 7.5 MG PO TABS: 7.5000 mg | ORAL_TABLET | Freq: Every day | ORAL | 0 refills | Status: DC

## 2023-11-02 MED ORDER — LIDOCAINE 5 % EX PTCH: 1.0000 | MEDICATED_PATCH | CUTANEOUS | 0 refills | Status: DC

## 2023-11-02 NOTE — Discharge Instructions (Addendum)
 Take Mobic  once daily to help with pain.  You can take 650 mg of Tylenol  every 4-6 hours as needed for breakthrough pain.  Do not exceed 4000 mg of Tylenol  in 1 day. Apply lidocaine  patches once daily.  You can keep these on for 12 hours. Alternate between ice and heat and do some gentle stretching to help with your pain. Follow-up with [facility] for further management of your chronic pain. Follow-up with  community [facility] and wellness regarding need for new primary care provider. Return here as needed.  ????? ????? ??? ????? ?????? ?????? ?????. ????? ????? 650 ??? ?? ???????? ?? 4-6 ????? ??? ?????? ????? ?????????. ?? ?????? 4000 ??? ?? ???????? ?? ????? ??????. ?? ?????? ????????? ??? ????? ??????. ????? ???????? ??? ???? 12 ????. ???? ??? ????? ???????? ??? ???? ?????? ?????? ??????? ?????? ?????. ???? ?? ???? ??? ???? ???? ??????? ????? ?? ????? ???? ??????. ???? ?? ???? ??? ???? ????? ????????? ???? ????? ??????? ??? ???? ????? ????? ????. ???? ??? ??? ??? ??????. tanawal mwbik maratan wahidatan ywmyan litakhfif al'almi. [place] tanawul 650 mulaghin min taylinul kula 4-6 saeat hasab alhajat lil'alam alaikhtiraqii. la tatajawaz 4000 mulaghin min taylinul fi alyawm alwahidi. dae lasiqat lidukayin maratan wahidatan ywmyan. [place] alaihtifaz biha limudat 12 saeatin. bddl bayn althalj walhararat waqum bibaed tamarin altamadud alkhafifat litakhfif al'almi. tabie mae markaz kun hilth liltibi alriyadii limazid min 'iidarat 'almik almuzmina. tabie Eric Garrett kun hilth lilsihat almujtamaeiat fima yataealaq bialhajat 'iilaa muqadam rieayat 'awaliat jadidin. airjie 'iilaa huna eind alhajati.

## 2023-11-02 NOTE — ED Triage Notes (Signed)
 Pt reports was seen here last week for left shoulder pain. Taking medications that were prescribed but still having pain in left shoulder and left hip  Denies any falls or injury.
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
## 2023-11-24 ENCOUNTER — Ambulatory Visit (HOSPITAL_COMMUNITY)
Admission: EM | Admit: 2023-11-24 | Discharge: 2023-11-24 | Disposition: A | Discharge status: Home / Self Care | Attending: Family Medicine | Admitting: Family Medicine

## 2023-11-24 ENCOUNTER — Encounter (HOSPITAL_COMMUNITY): Payer: Self-pay

## 2023-11-24 DIAGNOSIS — Z760 Encounter for issue of repeat prescription: Secondary | ICD-10-CM

## 2023-11-24 DIAGNOSIS — R739 Hyperglycemia, unspecified: Secondary | ICD-10-CM

## 2023-11-24 DIAGNOSIS — I10 Essential (primary) hypertension: Secondary | ICD-10-CM | POA: Diagnosis present

## 2023-11-24 LAB — POCT URINALYSIS DIP (MANUAL ENTRY)
Blood, UA: NEGATIVE
Glucose, UA: NEGATIVE mg/dL
Leukocytes, UA: NEGATIVE
Nitrite, UA: NEGATIVE
Protein Ur, POC: 30 mg/dL — AB
Spec Grav, UA: >=1.03 — AB (ref 1.010–1.025)
Urobilinogen, UA: 0.2 U/dL
pH, UA: 5.5 (ref 5.0–8.0)

## 2023-11-24 LAB — BASIC METABOLIC PANEL WITH GFR
Anion gap: 7 (ref 5–15)
BUN: 26 mg/dL — ABNORMAL HIGH (ref 8–23)
CO2: 26 mmol/L (ref 22–32)
Calcium: 9.3 mg/dL (ref 8.9–10.3)
Chloride: 103 mmol/L (ref 98–111)
Creatinine, Ser: 1.01 mg/dL (ref 0.61–1.24)
GFR, Estimated: >60 mL/min (ref >60)
Glucose, Bld: 137 mg/dL — ABNORMAL HIGH (ref 70–99)
Potassium: 4.2 mmol/L (ref 3.5–5.1)
Sodium: 136 mmol/L (ref 135–145)

## 2023-11-24 LAB — POCT FASTING CBG KUC MANUAL ENTRY: POCT Glucose (KUC): 282 mg/dL — AB (ref 70–99)

## 2023-11-24 MED ORDER — AMLODIPINE BESYLATE 5 MG PO TABS: 5.0000 mg | ORAL_TABLET | Freq: Every day | ORAL | 0 refills | Status: AC

## 2023-11-24 NOTE — ED Provider Notes (Signed)
 [facility]    CSN: [id number] Arrival date & time: 11/24/23  1415      History   Chief Complaint Chief Complaint  Patient presents with  . Headache  . Fatigue  . Insomnia    HPI Eric Garrett is a 68 y.o. male.   dOES NOT FEEL GOOD  Blood sugar is high today; drank water and milk.   No blurred vision, headache, chest pain, shortness of breath.    When does not sleep, heart beats fast.    Declines interpreter    Past Medical History:  Diagnosis Date  . Diabetes mellitus without complication (HCC)   . Hypertension   . Kidney stones     There are no active problems to display for this patient.   History reviewed. No pertinent surgical history.     Home Medications    Prior to Admission medications   Medication Sig Start Date End Date Taking? Authorizing Provider  amLODipine  (NORVASC ) 5 MG tablet Take 1 tablet (5 mg total) by mouth daily. 10/22/23 10/21/24 Yes Sofia, Leslie K, PA-C  HYDROcodone -acetaminophen  (NORCO/VICODIN) 5-325 MG tablet Take 1 tablet by mouth every 6 (six) hours as needed for severe pain. 03/06/20   Adolph Hoop, PA-C  lidocaine  (LIDODERM ) 5 % Place 1 patch onto the skin daily. Remove & Discard patch within 12 hours or as directed by MD 11/02/23   Karon Packer, NP  meloxicam  (MOBIC ) 7.5 MG tablet Take 1 tablet (7.5 mg total) by mouth daily. 11/02/23   Levora Reas A, NP  methocarbamol  (ROBAXIN ) 500 MG tablet Take 1 tablet (500 mg total) by mouth every 8 (eight) hours as needed for muscle spasms. 10/22/23   Sandi Crosby, PA-C  tamsulosin  (FLOMAX ) 0.4 MG CAPS capsule Take 1 capsule (0.4 mg total) by mouth daily. 09/24/19   Adolph Hoop, PA-C  tiZANidine  (ZANAFLEX ) 4 MG tablet Take 1 tablet (4 mg total) by mouth at bedtime. 02/02/22   Reena Canning, NP    Family History Family History  Problem Relation Age of Onset  . Diabetes Mother   . Diabetes Father   . Diabetes Sister   . Diabetes Brother     Social  History Social History   Tobacco Use  . Smoking status: Never  . Smokeless tobacco: Never  Vaping Use  . Vaping status: Never Used  Substance Use Topics  . Alcohol use: No  . Drug use: Never     Allergies   Patient has no known allergies.   Review of Systems Review of Systems   Physical Exam Triage Vital Signs ED Triage Vitals [11/24/23 1429]  Encounter Vitals Group     BP (!) 149/88     Systolic BP Percentile      Diastolic BP Percentile      Pulse Rate 85     Resp 16     Temp 98.5 F (36.9 C)     Temp Source Oral     SpO2 95 %     Weight      Height      Head Circumference      Peak Flow      Pain Score      Pain Loc      Pain Education      Exclude from Growth Chart    No data found.  Updated Vital Signs BP (!) 149/88 (BP Location: Left Arm)   Pulse 85   Temp 98.5 F (36.9 C) (Oral)   Resp  16   SpO2 95%   Visual Acuity Right Eye Distance:   Left Eye Distance:   Bilateral Distance:    Right Eye Near:   Left Eye Near:    Bilateral Near:     Physical Exam   UC Treatments / Results  Labs (all labs ordered are listed, but only abnormal results are displayed) Labs Reviewed  POCT FASTING CBG [facility] MANUAL ENTRY - Abnormal; Notable for the following components:      Result Value   POCT Glucose ([facility]) 282 (*)    All other components within normal limits  POCT URINALYSIS DIP (MANUAL ENTRY) - Abnormal; Notable for the following components:   Color, UA other (*)    Bilirubin, UA small (*)    Ketones, POC UA trace (5) (*)    Spec Grav, UA >=1.030 (*)    Protein Ur, POC =30 (*)    All other components within normal limits    EKG   Radiology No results found.  Procedures Procedures (including critical care time)  Medications Ordered in UC Medications - No data to display  Initial Impression / Assessment and Plan / UC Course  I have reviewed the triage vital signs and the nursing notes.  Pertinent labs & imaging results that were  available during my care of the patient were reviewed by me and considered in my medical decision making (see chart for details).     *** Final Clinical Impressions(s) / UC Diagnoses   Final diagnoses:  None   Discharge Instructions   None    ED Prescriptions   None    PDMP not reviewed this encounter.

## 2023-11-24 NOTE — ED Triage Notes (Signed)
 Patient reports not feeling well x 2 weeks. Patient has been experiencing insomnia and headaches.Patient states he has controlled diabetes and does not check his sugars.

## 2023-11-24 NOTE — Discharge Instructions (Addendum)
 The blood sugar today was 282.  The urine sample today does not show glucose, there is a little bit of ketones in your urine.  We are checking blood work today and if your kidney function is normal, can start you on a medicine for diabetes tomorrow called metformin.  I have also sent a refill of the blood pressure medicine to the pharmacy.  Please follow-up with your new primary care provider as planned.

## 2023-11-25 ENCOUNTER — Ambulatory Visit (HOSPITAL_COMMUNITY): Payer: Self-pay

## 2023-11-25 MED ORDER — METFORMIN HCL 500 MG PO TABS
500.0000 mg | ORAL_TABLET | Freq: Every day | ORAL | 0 refills | Status: AC
Start: 2023-11-25 — End: 2023-12-25

## 2023-11-25 NOTE — Telephone Encounter (Signed)
 Please let patient know his lab work came back normal so he can start Metformin for diabetes.  I sent the prescription to his pharmacy.  He needs to follow up as planned with PCP next month.

## 2023-12-31 ENCOUNTER — Ambulatory Visit: Admitting: Nurse Practitioner

## 2024-02-09 ENCOUNTER — Ambulatory Visit (HOSPITAL_COMMUNITY)
Admission: EM | Admit: 2024-02-09 | Discharge: 2024-02-09 | Disposition: A | Discharge status: Home / Self Care | Attending: Emergency Medicine | Admitting: Emergency Medicine

## 2024-02-09 ENCOUNTER — Encounter (HOSPITAL_COMMUNITY): Payer: Self-pay

## 2024-02-09 DIAGNOSIS — I10 Essential (primary) hypertension: Secondary | ICD-10-CM | POA: Diagnosis not present

## 2024-02-09 DIAGNOSIS — R519 Headache, unspecified: Secondary | ICD-10-CM

## 2024-02-09 DIAGNOSIS — M542 Cervicalgia: Secondary | ICD-10-CM

## 2024-02-09 MED ORDER — IBUPROFEN 800 MG PO TABS
800.0000 mg | ORAL_TABLET | Freq: Once | ORAL | Status: AC
  Administered 2024-02-09: 800 mg via ORAL

## 2024-02-09 MED ORDER — IBUPROFEN 800 MG PO TABS
ORAL_TABLET | ORAL | Status: AC
  Filled 2024-02-09: qty 1

## 2024-02-09 NOTE — Discharge Instructions (Signed)
 Alternate between 600 mg of Tylenol  and 400 mg ibuprofen  every 6-8 hours as needed for headache and neck pain. Be sure you are taking your blood pressure medication as prescribed daily. Make sure you are staying hydrated and getting plenty of rest as this could be contributing to your pain. Follow-up with your primary care provider for further evaluation and management of your recurrent headaches and blood pressure concerns. If you develop worsening headache, blurred vision, excessive vomiting, weakness, numbness, confusion, slurred speech, or facial droop please seek immediate medical treatment in the emergency department.

## 2024-02-09 NOTE — ED Triage Notes (Addendum)
 Pt c/o headache and neck pain x5 days. States hx of HTN, taking meds every day. Denies taken meds for headache. States his PCP stopped his metformin  a month ago.

## 2024-02-09 NOTE — ED Provider Notes (Addendum)
 [facility]    CSN: [id number] Arrival date & time: 02/09/24  1448      History   Chief Complaint Chief Complaint  Patient presents with   Headache    HPI Eric Garrett is a 68 y.o. male.   Patient presents with intermittent left-sided head and neck pain x 5 days.  Denies blurred vision, dizziness, weakness, numbness, confusion, slurred speech, and facial droop.  Patient rates the pain a 4 out of 10 at this time.  Patient denies taking any medication for his pain.  Patient denies any recent falls or head injuries.   Patient does report history of headaches.  Patient states that he has seen his primary care provider regarding this in the past.  Patient denies taking any medication for headaches on a daily basis.  Patient does have a history of hypertension and is prescribed amlodipine .  Patient states that he does take this daily as prescribed, but did not take it today.  Patient states that he did he see his primary care provider just last week for his blood pressure and they did not adjust any of his medications at that time.  The history is provided by the patient and medical records. The history is limited by a language barrier. No language interpreter was used (Patient declined, patient speaks [place]).  Headache   Past Medical History:  Diagnosis Date   Diabetes mellitus without complication (HCC)    Hypertension    Kidney stones     There are no active problems to display for this patient.   History reviewed. No pertinent surgical history.     Home Medications    Prior to Admission medications   Medication Sig Start Date End Date Taking? Authorizing Provider  amLODipine  (NORVASC ) 5 MG tablet Take 1 tablet (5 mg total) by mouth daily. 11/24/23 11/23/24  Chandra Harlene LABOR, NP  metFORMIN  (GLUCOPHAGE ) 500 MG tablet Take 1 tablet (500 mg total) by mouth daily with breakfast. Patient not taking: Reported on 02/09/2024 11/25/23 12/25/23  Chandra Harlene LABOR,  NP    Family History Family History  Problem Relation Age of Onset   Diabetes Mother    Diabetes Father    Diabetes Sister    Diabetes Brother     Social History Social History   Tobacco Use   Smoking status: Never   Smokeless tobacco: Never  Vaping Use   Vaping status: Never Used  Substance Use Topics   Alcohol use: No   Drug use: Never     Allergies   Patient has no known allergies.   Review of Systems Review of Systems  Neurological:  Positive for headaches.   Per HPI  Physical Exam Triage Vital Signs ED Triage Vitals [02/09/24 1530]  Encounter Vitals Group     BP (!) 152/84     Girls Systolic BP Percentile      Girls Diastolic BP Percentile      Boys Systolic BP Percentile      Boys Diastolic BP Percentile      Pulse Rate 83     Resp 18     Temp 98.3 F (36.8 C)     Temp Source Oral     SpO2 98 %     Weight      Height      Head Circumference      Peak Flow      Pain Score      Pain Loc      Pain Education  Exclude from Growth Chart    No data found.  Updated Vital Signs BP (!) 152/84 (BP Location: Left Arm)   Pulse 83   Temp 98.3 F (36.8 C) (Oral)   Resp 18   SpO2 98%   Visual Acuity Right Eye Distance:   Left Eye Distance:   Bilateral Distance:    Right Eye Near:   Left Eye Near:    Bilateral Near:     Physical Exam Vitals and nursing note reviewed.  Constitutional:      General: He is awake. He is not in acute distress.    Appearance: Normal appearance. He is well-developed and well-groomed. He is not ill-appearing.  HENT:     Right Ear: Tympanic membrane, ear canal and external ear normal.     Left Ear: Tympanic membrane, ear canal and external ear normal.     Nose: Nose normal.     Mouth/Throat:     Mouth: Mucous membranes are moist.     Pharynx: Oropharynx is clear.  Eyes:     Extraocular Movements: Extraocular movements intact.     Pupils: Pupils are equal, round, and reactive to light.  Neck:     Comments:  Tenderness noted to left-sided cervical paraspinal musculature. Cardiovascular:     Rate and Rhythm: Normal rate and regular rhythm.  Pulmonary:     Effort: Pulmonary effort is normal.     Breath sounds: Normal breath sounds.  Musculoskeletal:        General: Normal range of motion.     Cervical back: Normal range of motion and neck supple. Muscular tenderness present. No spinous process tenderness. Normal range of motion.  Skin:    General: Skin is warm and dry.  Neurological:     General: No focal deficit present.     Mental Status: He is alert and oriented to person, place, and time. Mental status is at baseline.     GCS: GCS eye subscore is 4. GCS verbal subscore is 5. GCS motor subscore is 6.     Cranial Nerves: Cranial nerves 2-12 are intact.     Sensory: Sensation is intact.     Motor: Motor function is intact.     Coordination: Coordination is intact.     Gait: Gait is intact.  Psychiatric:        Behavior: Behavior is cooperative.      UC Treatments / Results  Labs (all labs ordered are listed, but only abnormal results are displayed) Labs Reviewed - No data to display  EKG   Radiology No results found.  Procedures Procedures (including critical care time)  Medications Ordered in UC Medications  ibuprofen  (ADVIL ) tablet 800 mg (800 mg Oral Given 02/09/24 1612)    Initial Impression / Assessment and Plan / UC Course  I have reviewed the triage vital signs and the nursing notes.  Pertinent labs & imaging results that were available during my care of the patient were reviewed by me and considered in my medical decision making (see chart for details).     Patient is overall well-appearing.  Vitals are stable.  No neurodeficits noted on exam.  GCS 15.  Neck pain likely muscular in nature due to tenderness upon palpation.  Given ibuprofen  in clinic for acute pain.  Recommended Tylenol  ibuprofen  as needed for headache and neck pain.  Discussed importance of  taking blood pressure medication daily as prescribed.  Recommended patient follow-up with primary care provider regarding any additional concerns for recurring headaches  or blood pressure management.  Discussed follow-up, return, and strict ER precautions. Final Clinical Impressions(s) / UC Diagnoses   Final diagnoses:  Bad headache  Neck pain, acute  Elevated blood pressure reading in office with diagnosis of hypertension     Discharge Instructions      Alternate between 600 mg of Tylenol  and 400 mg ibuprofen  every 6-8 hours as needed for headache and neck pain. Be sure you are taking your blood pressure medication as prescribed daily. Make sure you are staying hydrated and getting plenty of rest as this could be contributing to your pain. Follow-up with your primary care provider for further evaluation and management of your recurrent headaches and blood pressure concerns. If you develop worsening headache, blurred vision, excessive vomiting, weakness, numbness, confusion, slurred speech, or facial droop please seek immediate medical treatment in the emergency department.    ED Prescriptions   None    PDMP not reviewed this encounter.   Johnie Rumaldo LABOR, NP 02/09/24 1612    Johnie Rumaldo LABOR, NP 02/09/24 1612    Johnie Rumaldo A, NP 02/09/24 [phone]
